# Patient Record
Sex: Male | Born: 1935 | Race: White | Hispanic: No | Marital: Single | Smoking: Never smoker
Health system: Southern US, Community
[De-identification: ages and names within clinical notes are randomized; demographics above are authoritative.]

## PROBLEM LIST (undated history)

## (undated) DIAGNOSIS — J189 Pneumonia, unspecified organism: Secondary | ICD-10-CM

## (undated) DIAGNOSIS — G8250 Quadriplegia, unspecified: Secondary | ICD-10-CM

## (undated) DIAGNOSIS — K229 Disease of esophagus, unspecified: Secondary | ICD-10-CM

## (undated) DIAGNOSIS — N390 Urinary tract infection, site not specified: Secondary | ICD-10-CM

## (undated) DIAGNOSIS — G809 Cerebral palsy, unspecified: Secondary | ICD-10-CM

## (undated) DIAGNOSIS — I10 Essential (primary) hypertension: Secondary | ICD-10-CM

## (undated) DIAGNOSIS — K219 Gastro-esophageal reflux disease without esophagitis: Secondary | ICD-10-CM

## (undated) HISTORY — PX: HAND SURGERY: SHX662

## (undated) HISTORY — PX: HERNIA REPAIR: SHX51

## (undated) HISTORY — PX: OTHER SURGICAL HISTORY: SHX169

---

## 1997-08-18 ENCOUNTER — Other Ambulatory Visit: Admission: RE | Admit: 1997-08-18 | Discharge: 1997-08-18 | Payer: Self-pay | Admitting: Internal Medicine

## 1997-08-25 ENCOUNTER — Other Ambulatory Visit: Admission: RE | Admit: 1997-08-25 | Discharge: 1997-08-25 | Payer: Self-pay | Admitting: Internal Medicine

## 1999-01-11 ENCOUNTER — Encounter: Payer: Self-pay | Admitting: Urology

## 1999-01-11 ENCOUNTER — Inpatient Hospital Stay (HOSPITAL_COMMUNITY): Admission: EM | Admit: 1999-01-11 | Discharge: 1999-01-15 | Payer: Self-pay | Admitting: Emergency Medicine

## 1999-01-12 ENCOUNTER — Encounter: Payer: Self-pay | Admitting: Urology

## 2001-12-03 ENCOUNTER — Encounter: Payer: Self-pay | Admitting: Internal Medicine

## 2001-12-03 ENCOUNTER — Ambulatory Visit (HOSPITAL_COMMUNITY): Admission: RE | Admit: 2001-12-03 | Discharge: 2001-12-03 | Payer: Self-pay | Admitting: Internal Medicine

## 2001-12-10 ENCOUNTER — Ambulatory Visit (HOSPITAL_COMMUNITY): Admission: RE | Admit: 2001-12-10 | Discharge: 2001-12-10 | Payer: Self-pay | Admitting: Internal Medicine

## 2001-12-10 ENCOUNTER — Encounter: Payer: Self-pay | Admitting: Internal Medicine

## 2001-12-11 ENCOUNTER — Emergency Department (HOSPITAL_COMMUNITY): Admission: EM | Admit: 2001-12-11 | Discharge: 2001-12-12 | Payer: Self-pay | Admitting: Emergency Medicine

## 2001-12-11 ENCOUNTER — Encounter: Payer: Self-pay | Admitting: Emergency Medicine

## 2001-12-31 ENCOUNTER — Encounter: Payer: Self-pay | Admitting: Internal Medicine

## 2001-12-31 ENCOUNTER — Ambulatory Visit (HOSPITAL_COMMUNITY): Admission: RE | Admit: 2001-12-31 | Discharge: 2001-12-31 | Payer: Self-pay | Admitting: Gastroenterology

## 2001-12-31 ENCOUNTER — Encounter (INDEPENDENT_AMBULATORY_CARE_PROVIDER_SITE_OTHER): Payer: Self-pay | Admitting: Specialist

## 2002-08-03 ENCOUNTER — Inpatient Hospital Stay (HOSPITAL_COMMUNITY): Admission: RE | Admit: 2002-08-03 | Discharge: 2002-08-04 | Payer: Self-pay | Admitting: General Surgery

## 2002-08-03 ENCOUNTER — Encounter: Payer: Self-pay | Admitting: General Surgery

## 2002-11-17 ENCOUNTER — Emergency Department (HOSPITAL_COMMUNITY): Admission: EM | Admit: 2002-11-17 | Discharge: 2002-11-18 | Payer: Self-pay | Admitting: Emergency Medicine

## 2002-11-18 ENCOUNTER — Encounter: Payer: Self-pay | Admitting: Emergency Medicine

## 2003-07-01 ENCOUNTER — Emergency Department (HOSPITAL_COMMUNITY): Admission: EM | Admit: 2003-07-01 | Discharge: 2003-07-01 | Payer: Self-pay | Admitting: *Deleted

## 2003-12-14 ENCOUNTER — Ambulatory Visit (HOSPITAL_COMMUNITY): Admission: RE | Admit: 2003-12-14 | Discharge: 2003-12-14 | Payer: Self-pay | Admitting: Internal Medicine

## 2004-06-16 ENCOUNTER — Emergency Department (HOSPITAL_COMMUNITY): Admission: EM | Admit: 2004-06-16 | Discharge: 2004-06-16 | Payer: Self-pay | Admitting: Emergency Medicine

## 2005-11-24 ENCOUNTER — Emergency Department (HOSPITAL_COMMUNITY): Admission: EM | Admit: 2005-11-24 | Discharge: 2005-11-24 | Payer: Self-pay | Admitting: Family Medicine

## 2006-01-20 ENCOUNTER — Ambulatory Visit (HOSPITAL_COMMUNITY): Admission: RE | Admit: 2006-01-20 | Discharge: 2006-01-20 | Payer: Self-pay | Admitting: Internal Medicine

## 2006-04-25 ENCOUNTER — Emergency Department (HOSPITAL_COMMUNITY): Admission: EM | Admit: 2006-04-25 | Discharge: 2006-04-25 | Payer: Self-pay | Admitting: Emergency Medicine

## 2006-05-07 ENCOUNTER — Inpatient Hospital Stay (HOSPITAL_COMMUNITY): Admission: EM | Admit: 2006-05-07 | Discharge: 2006-05-12 | Payer: Self-pay | Admitting: Emergency Medicine

## 2006-08-27 ENCOUNTER — Ambulatory Visit (HOSPITAL_COMMUNITY): Admission: RE | Admit: 2006-08-27 | Discharge: 2006-08-27 | Payer: Self-pay | Admitting: Internal Medicine

## 2007-04-08 ENCOUNTER — Ambulatory Visit: Payer: Self-pay | Admitting: Internal Medicine

## 2007-04-24 ENCOUNTER — Ambulatory Visit (HOSPITAL_COMMUNITY): Admission: RE | Admit: 2007-04-24 | Discharge: 2007-04-24 | Payer: Self-pay | Admitting: Internal Medicine

## 2007-08-12 ENCOUNTER — Emergency Department (HOSPITAL_COMMUNITY): Admission: EM | Admit: 2007-08-12 | Discharge: 2007-08-12 | Payer: Self-pay | Admitting: Emergency Medicine

## 2007-10-08 ENCOUNTER — Inpatient Hospital Stay (HOSPITAL_COMMUNITY): Admission: EM | Admit: 2007-10-08 | Discharge: 2007-10-19 | Payer: Self-pay | Admitting: Emergency Medicine

## 2007-10-09 ENCOUNTER — Encounter (INDEPENDENT_AMBULATORY_CARE_PROVIDER_SITE_OTHER): Payer: Self-pay | Admitting: *Deleted

## 2007-10-09 ENCOUNTER — Ambulatory Visit: Payer: Self-pay | Admitting: Vascular Surgery

## 2007-10-14 ENCOUNTER — Ambulatory Visit: Payer: Self-pay | Admitting: Infectious Disease

## 2007-10-26 ENCOUNTER — Encounter: Payer: Self-pay | Admitting: Infectious Disease

## 2007-11-02 ENCOUNTER — Telehealth: Payer: Self-pay | Admitting: Infectious Disease

## 2007-11-02 ENCOUNTER — Ambulatory Visit: Payer: Self-pay | Admitting: Infectious Disease

## 2007-11-02 DIAGNOSIS — IMO0002 Reserved for concepts with insufficient information to code with codable children: Secondary | ICD-10-CM | POA: Insufficient documentation

## 2007-11-02 DIAGNOSIS — IMO0001 Reserved for inherently not codable concepts without codable children: Secondary | ICD-10-CM

## 2007-11-03 ENCOUNTER — Encounter: Payer: Self-pay | Admitting: Infectious Disease

## 2007-11-09 ENCOUNTER — Ambulatory Visit: Payer: Self-pay | Admitting: Infectious Disease

## 2007-11-09 DIAGNOSIS — G809 Cerebral palsy, unspecified: Secondary | ICD-10-CM | POA: Insufficient documentation

## 2007-11-09 DIAGNOSIS — N390 Urinary tract infection, site not specified: Secondary | ICD-10-CM | POA: Insufficient documentation

## 2007-11-09 LAB — CONVERTED CEMR LAB
BUN: 25 mg/dL — ABNORMAL HIGH (ref 6–23)
Basophils Relative: 0 % (ref 0–1)
CO2: 23 meq/L (ref 19–32)
Chloride: 100 meq/L (ref 96–112)
Creatinine, Ser: 0.87 mg/dL (ref 0.40–1.50)
Hemoglobin: 12.9 g/dL — ABNORMAL LOW (ref 13.0–17.0)
Lymphocytes Relative: 20 % (ref 12–46)
MCHC: 33.2 g/dL (ref 30.0–36.0)
Monocytes Absolute: 0.7 10*3/uL (ref 0.1–1.0)
Monocytes Relative: 9 % (ref 3–12)
Neutro Abs: 5.1 10*3/uL (ref 1.7–7.7)
Neutrophils Relative %: 67 % (ref 43–77)
Potassium: 4.3 meq/L (ref 3.5–5.3)
RBC: 4.27 M/uL (ref 4.22–5.81)
WBC: 7.6 10*3/uL (ref 4.0–10.5)

## 2007-11-16 ENCOUNTER — Ambulatory Visit: Payer: Self-pay | Admitting: Infectious Disease

## 2009-03-11 ENCOUNTER — Emergency Department (HOSPITAL_COMMUNITY): Admission: EM | Admit: 2009-03-11 | Discharge: 2009-03-11 | Payer: Self-pay | Admitting: Emergency Medicine

## 2009-07-03 ENCOUNTER — Ambulatory Visit: Payer: Self-pay | Admitting: Internal Medicine

## 2009-07-03 DIAGNOSIS — Z860100 Personal history of colon polyps, unspecified: Secondary | ICD-10-CM | POA: Insufficient documentation

## 2009-07-03 DIAGNOSIS — K2270 Barrett's esophagus without dysplasia: Secondary | ICD-10-CM

## 2009-08-14 ENCOUNTER — Ambulatory Visit: Payer: Self-pay | Admitting: Internal Medicine

## 2009-08-23 ENCOUNTER — Telehealth: Payer: Self-pay | Admitting: Internal Medicine

## 2009-10-11 ENCOUNTER — Ambulatory Visit (HOSPITAL_COMMUNITY): Admission: RE | Admit: 2009-10-11 | Discharge: 2009-10-11 | Payer: Self-pay | Admitting: Internal Medicine

## 2009-12-09 IMAGING — RF DG NASO G TUBE PLC W/FL W/RAD
4 series · 4 of 4 positions shown · non-contrast
Comparison: None

CLINICAL DATA: Requires feeding tube

NG/OG TUBE BY VASWANI

[Series 1: run · 1 of 1 slices shown (1 of 4)]
[im 1/1]
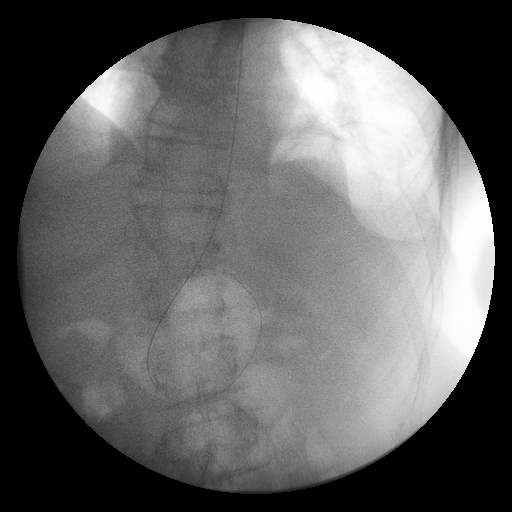

[Series 2: run · 1 of 1 slices shown (2 of 4)]
[im 1/1]
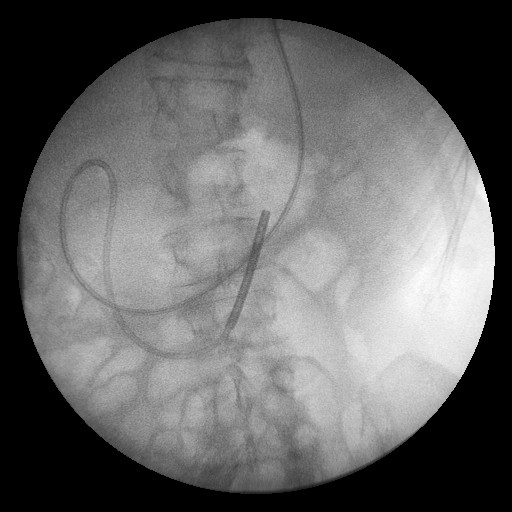

[Series 3: run · 1 of 1 slices shown (3 of 4)]
[im 1/1]
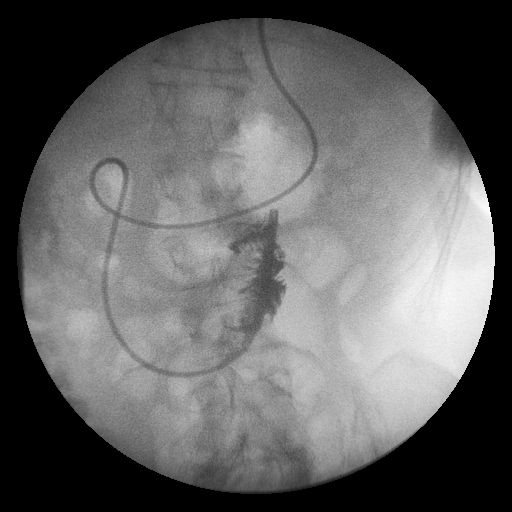

[Series 4: run · 1 of 1 slices shown (4 of 4)]
[im 1/1]
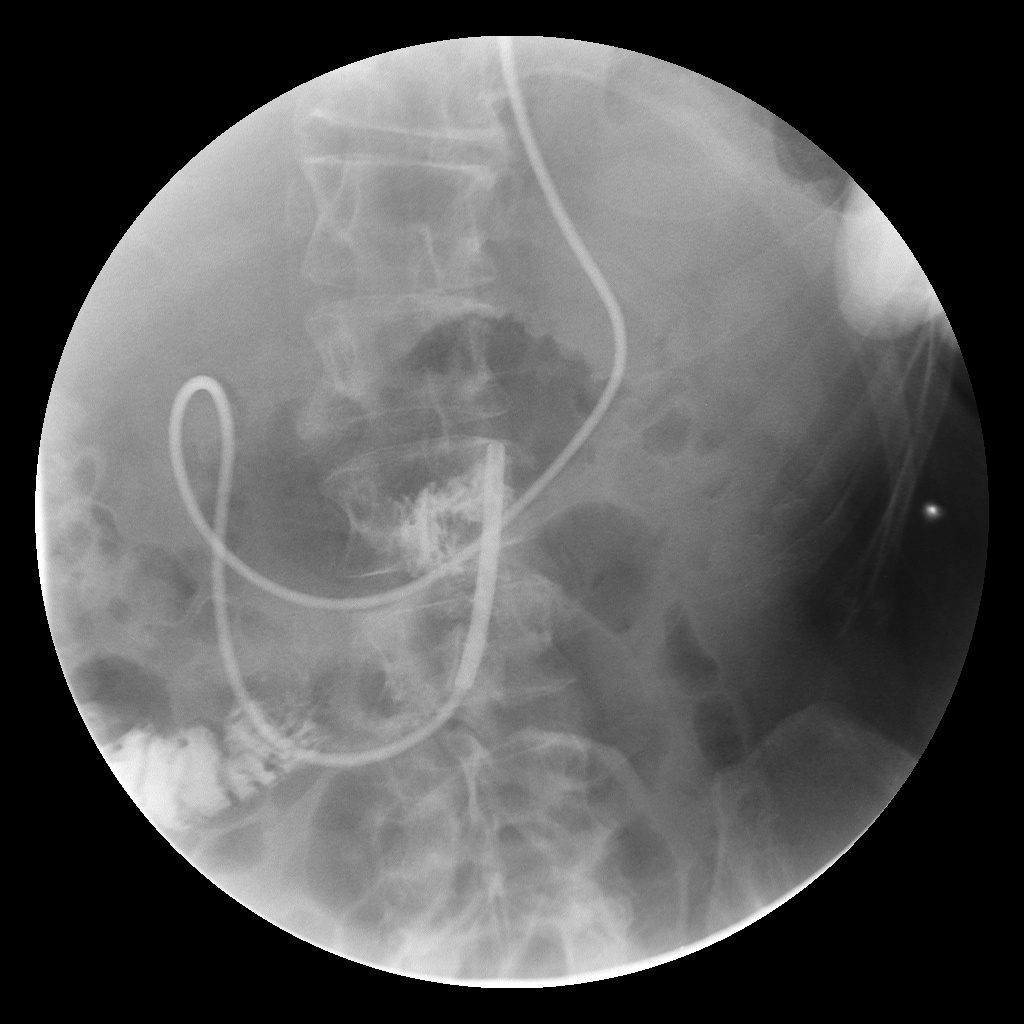

[4 of 4 positions shown; findings below may reference images not displayed]

FINDINGS: Under fluoroscopic guidance a feeding tube was placed
into the duodenum at the level of the ligament of Treitz.  Water-
soluble contrast material was injected through the feeding tube
confirming placement.  No complicating features identified.

5.5 minutes of fluoroscopy time was used.
IMPRESSION: Successful placement of feeding tube under fluoroscopic guidance to
the level of the ligament of Treitz.

## 2009-12-11 ENCOUNTER — Ambulatory Visit (HOSPITAL_COMMUNITY): Admission: RE | Admit: 2009-12-11 | Discharge: 2009-12-11 | Payer: Self-pay | Admitting: Urology

## 2009-12-14 IMAGING — CR DG CHEST 1V PORT
1 series · 1 of 1 positions shown · non-contrast
Comparison: 10/14/2096

CLINICAL DATA: Right upper extremity cellulitis.  Repositioning of
PICC placement.

PORTABLE CHEST - 1 VIEW

[AP]
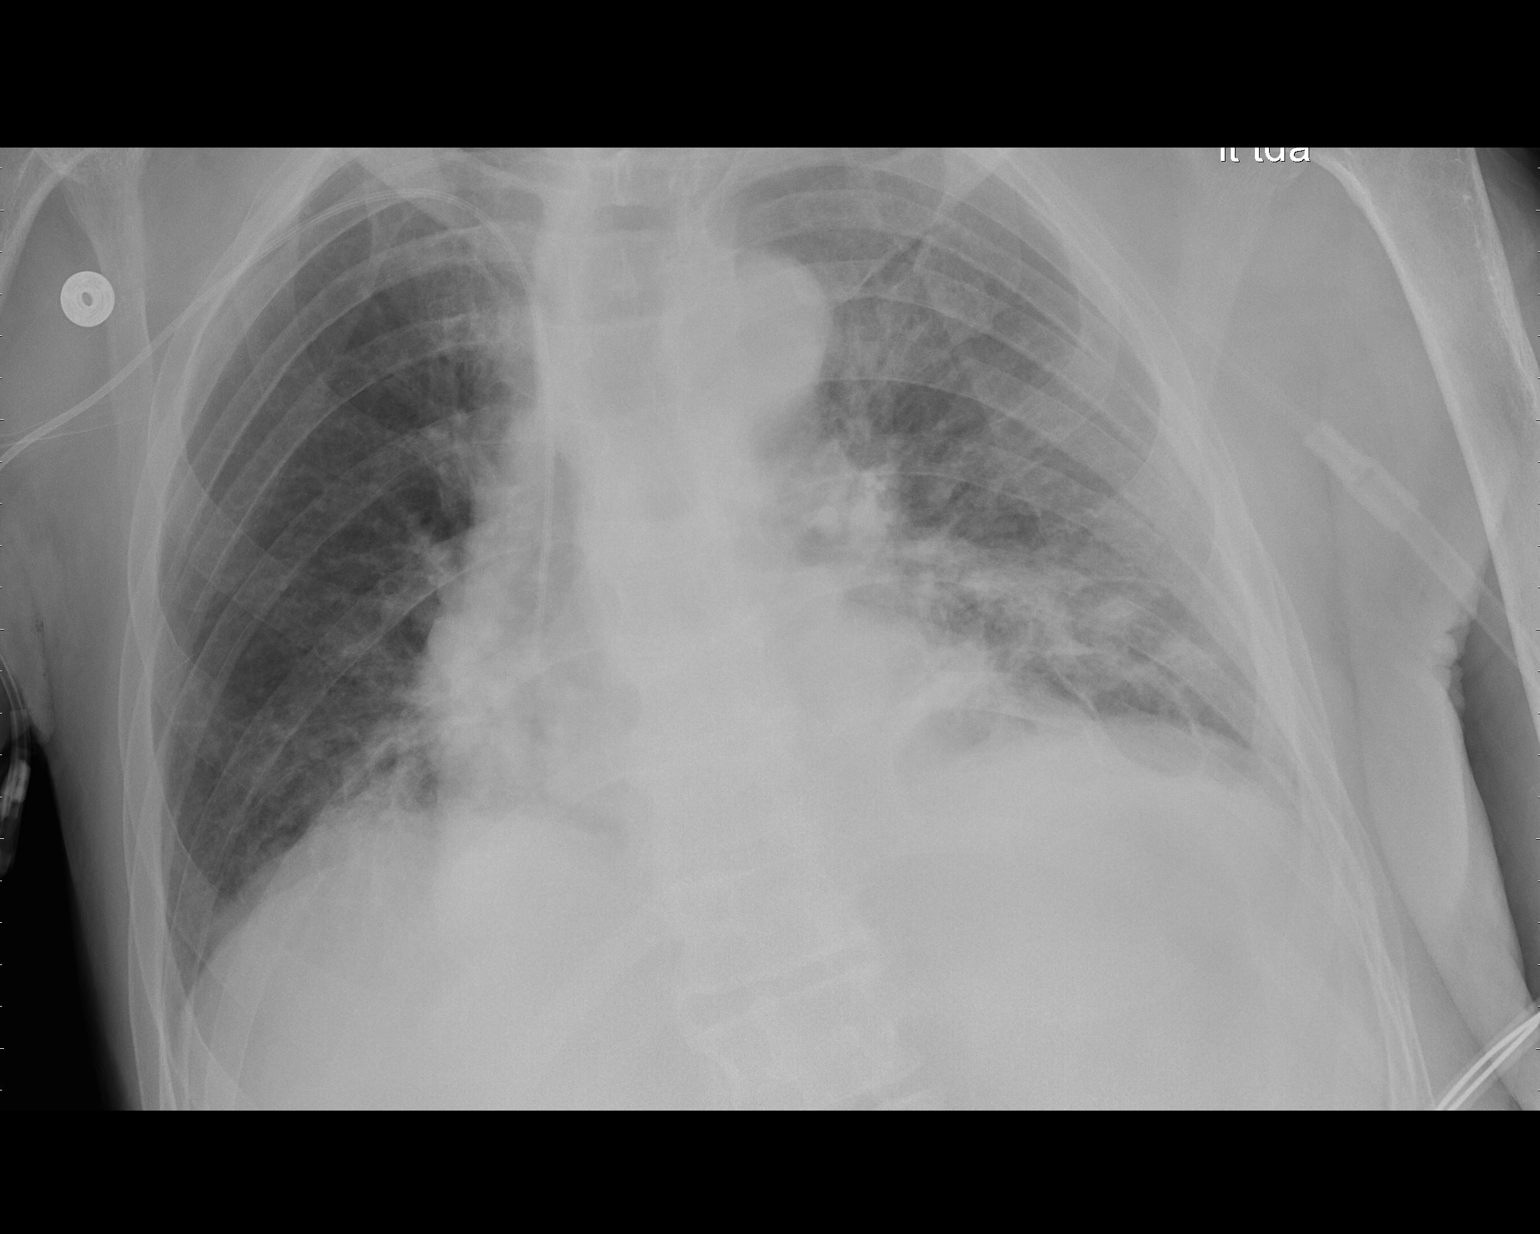

[1 of 1 positions shown; findings below may reference images not displayed]

FINDINGS: The PICC line now follows a normal course into the IVC.
The tip is at or potentially just below the cavoatrial junction.
Bibasilar airspace disease is redemonstrated.  Lung volumes are
low.
IMPRESSION: 1.  Repositioning of PICC line with the tip at or just below the
cavoatrial junction.
2.  Redemonstration of bibasilar airspace disease.

## 2010-04-15 ENCOUNTER — Encounter: Payer: Self-pay | Admitting: Internal Medicine

## 2010-04-24 NOTE — Assessment & Plan Note (Signed)
Summary: discuss EGD/sheri   History of Present Illness Visit Type: Follow-up Visit Primary GI MD: [name] MD [facility] Primary Provider: [name], MD Requesting Provider: [name], MD Chief Complaint: Discuss Barrett's and colon polyp surveillance History of Present Illness:   77 yo wm with cerebral pasy, GERD, Barrett's esophagus and prior colonic adenoma. Last endoscopic procedures 2003. He is in a wheelchair and lives at [facility]. He had been here last month and it was clarified with Dr. [name] that he was sent for possible surveillance/screening for Barrett's and colon cancer/polyps.   GI Review of Systems      Denies abdominal pain, acid reflux, belching, bloating, chest pain, dysphagia with liquids, dysphagia with solids, heartburn, loss of appetite, nausea, vomiting, vomiting blood, weight loss, and  weight gain.        Denies anal fissure, black tarry stools, change in bowel habit, constipation, diarrhea, diverticulosis, fecal incontinence, heme positive stool, hemorrhoids, irritable bowel syndrome, jaundice, light color stool, liver problems, rectal bleeding, and  rectal pain.    Current Medications (verified): 1)  Enalapril Maleate 20 Mg Tabs (Enalapril Maleate) .... One Take Tablet By Mouth Once Daily 2)  Metoclopramide Hcl 5 Mg  Tabs (Metoclopramide Hcl) .Marland Kitchen.. 1 Tab By Mouth Tid 3)  Ibuprofen 800 Mg  Tabs (Ibuprofen) .Marland Kitchen.. 1 Tablet By Mouth Three Times A Day As Needed For Pain 4)  Fortical 200 Unit/act Nasal Soln (Calcitonin ([place]mon)) .Marland Kitchen.. 1 Spray in  1 Nostril Daily 5)  Omeprazole 20 Mg  Cpdr (Omeprazole) .Marland Kitchen.. 1 Capsule By Mouth Qd 6)  Oxybutynin Chloride 10 Mg  Xr24h-Tab (Oxybutynin Chloride) .Marland Kitchen.. 1 Tab By Mouth Qd 7)  Tamsulosin Hcl 0.4 Mg Caps (Tamsulosin Hcl) .... One Tablet By Mouth Once Daily 8)  Docusate Calcium 240 Mg Caps (Docusate Calcium) .... One Tablet By Mouth Once Daily 9)  Milk of Magnesia 400 Mg/57ml Susp (Magnesium Hydroxide) .... 30ml Once  Daily 10)  Phenazopyridine Hcl 100 Mg Tabs (Phenazopyridine Hcl) .... One Tablet By Mouth Once Daily 11)  Miralax  Powd (Polyethylene Glycol 3350) .... As Needed 12)  Vitamin D3 1000iu Softgel .... Once Daily 13)  Thick It Original Instant Powder .... Use 1 Tablespoonful With Meal With Liquid 14)  Fleet Pediatric 3.5-9.5 Gm/80ml Enem (Sodium Phosphates) .... As Needed For Constipation  Allergies (verified): No Known Drug Allergies  Past History:  Past Medical History: Reviewed history from 07/03/2009 and no changes required. The patient's past medical history is significant for:   1. Cerebral palsy with quadriparesis.   2. Gastroesophageal reflux disease.   3. History of urinary tract infection with Citrobacter.   4. Dysphagia.   5. Quadriplegia.   6. Chronic recurrent bladder infections and bladder dysfunction       related to cerebral palsy.   7. Benign prostatic hypertrophy.   8. Barrett's esophagus.   9 Celllulitis and myositis as described above HTN Iron defiicency anemia  Past Surgical History: Hernia Surgery  Family History: No FH of Colon Cancer: Aneurysm brain: Mother Stroke: Father  Vital Signs:  Patient profile:   77 year old male Pulse rate:   64 / minute Pulse rhythm:   regular BP sitting:   100 / 68  (left arm) Cuff size:   regular  Vitals Entered By: June McMurray CMA Duncan Dull) (Aug 14, 2009 11:05 AM)  Physical Exam  General:  wheelchair body contorted   Impression & Recommendations:  Problem # 1:  BARRETT'S ESOPHAGUS (ICD-530.85) Assessment Unchanged We discussed this condition  and its cancer risk. He would not tolerate complications well and we have decided not to pursue screening/surveillance as risks do not seem to justify potential benefit. Will stop metacloprmide given known possible side effects. I think he could have some jerking movements of head related to this but unclear if that is cause given CP  If withdrawal not tolerated  restart Increase omeprazole to 40 mg daily.  no routine surveillance given comorbidities  Problem # 2:  PERSONAL HX COLONIC POLYPS (ICD-V12.72) Assessment: Unchanged 8 mm adenoma 2003 We discussed this condition and its cancer risk. He would not tolerate complications well and we have decided not to pursue screening/surveillance as risks do not seem to justify potential benefit.   Patient Instructions: 1)  Please schedule a follow-up appointment as needed for dysphagia. or other gastrointestinal problems 2)  Increase Omeprazole to 40mg  daily. 3)  Stop taking the metoclopramide.  4)  Copy sent to : [name], MD, [facility] 5)  The medication list was reviewed and reconciled.  All changed / newly prescribed medications were explained.  A complete medication list was provided to the patient / caregiver. Prescriptions: OMEPRAZOLE 40 MG CPDR (OMEPRAZOLE) 1 by mouth each AM 30-60 minutes before breakfast  #30 x 11   Entered and Authorized by:   Iva Boop MD, [facility]   Signed by:   Iva Boop MD, [facility] on 08/14/2009   Method used:   Print then Give to Patient   RxID:   [id number]

## 2010-04-24 NOTE — Assessment & Plan Note (Signed)
Summary: HX OF BARRETTS & POLYPS/YF   History of Present Illness Visit Type: consult  Primary GI MD: Victor Head MD [facility] Primary Provider: [name], MD Requesting Provider: [name], MD Chief Complaint: Barrett's esophagus History of Present Illness:   77 yo white man with prior diagnosis of Barrett's esophagus and adenoma of colon (2003). Procedures performed by [name]. He has cerebral palsy and a speech imediment but I a am able to understand him.No dysphagia, he says. He is not not really interested in endoscopy " unless I have to ".   GI Review of Systems      Denies abdominal pain, acid reflux, belching, bloating, chest pain, dysphagia with liquids, dysphagia with solids, heartburn, loss of appetite, nausea, vomiting, vomiting blood, weight loss, and  weight gain.        Denies anal fissure, black tarry stools, change in bowel habit, constipation, diarrhea, diverticulosis, fecal incontinence, heme positive stool, hemorrhoids, irritable bowel syndrome, jaundice, light color stool, liver problems, rectal bleeding, and  rectal pain.    Colonoscopy  Procedure date:  12/31/2001  Findings:      8 mm adenoma diverticulosis  EGD  Procedure date:  12/31/2001  Findings:      Gastritis Barrett's esophagus - long-segment   Current Medications (verified): 1)  Enalapril Maleate 20 Mg Tabs (Enalapril Maleate) .... One Take Tablet By Mouth Once Daily 2)  Metoclopramide Hcl 5 Mg  Tabs (Metoclopramide Hcl) .Marland Kitchen.. 1 Tab By Mouth Tid 3)  Ibuprofen 800 Mg  Tabs (Ibuprofen) .Marland Kitchen.. 1 Tablet By Mouth Three Times A Day As Needed For Pain 4)  Fortical 200 Unit/act Nasal Soln (Calcitonin ([place])) .Marland Kitchen.. 1 Spray in  1 Nostril Daily 5)  Omeprazole 20 Mg  Cpdr (Omeprazole) .Marland Kitchen.. 1 Capsule By Mouth Qd 6)  Oxybutynin Chloride 10 Mg  Xr24h-Tab (Oxybutynin Chloride) .Marland Kitchen.. 1 Tab By Mouth Qd 7)  Tamsulosin Hcl 0.4 Mg Caps (Tamsulosin Hcl) .... One Tablet By Mouth Once Daily 8)  Docusate  Calcium 240 Mg Caps (Docusate Calcium) .... One Tablet By Mouth Once Daily 9)  Milk of Magnesia 400 Mg/13ml Susp (Magnesium Hydroxide) .... 30ml Once Daily 10)  Phenazopyridine Hcl 100 Mg Tabs (Phenazopyridine Hcl) .... One Tablet By Mouth Once Daily 11)  Miralax  Powd (Polyethylene Glycol 3350) .... As Needed  Allergies (verified): No Known Drug Allergies  Past History:  Past Medical History: The patient's past medical history is significant for:   1. Cerebral palsy with quadriparesis.   2. Gastroesophageal reflux disease.   3. History of urinary tract infection with Citrobacter.   4. Dysphagia.   5. Quadriplegia.   6. Chronic recurrent bladder infections and bladder dysfunction       related to cerebral palsy.   7. Benign prostatic hypertrophy.   8. Barrett's esophagus.   9 Celllulitis and myositis as described above HTN Iron defiicency anemia  Family History: Reviewed history and no changes required.  Social History: Reviewed history from 11/09/2007 and no changes required. The patient resides at in [facility].      Review of Systems       in a wheelchair due to CP All other ROS negative except as per HPI.   Vital Signs:  Patient profile:   77 year old male Pulse rate:   60 / minute Pulse rhythm:   regular BP sitting:   98 / 58  (right arm) Cuff size:   regular  Vitals Entered By: [name] (July 03, 2009 11:41 AM)  Physical Exam  General:  wheelchair body contorted Eyes:  anicteric Mouth:  clear Neck:  supple Chest Wall:  kyphosisscoliosis.   Lungs:  Clear throughout to auscultation. Heart:  Regular rate and rhythm; no murmurs, rubs,  or bruits. Abdomen:  soft and nontender Neurologic:  Alert and  oriented x3 in a wheelchair unable to bear weight Psych:  Alert and cooperative. Normal mood and affect.   Impression & Recommendations:  Problem # 1:  BARRETT'S ESOPHAGUS (ICD-530.85) Assessment Unchanged discuss with [name] pt not  necessarily interested ? if risk benefit ratio worth it  Problem # 2:  PERSONAL HX COLONIC POLYPS (ICD-V12.72) Assessment: Unchanged  Patient Instructions: 1)  Dr. Leone Barrett will discuss your problems of Barrett's esophagus and prior colon polyps with Dr. Oneta Barrett and let you know our recommendations. 2)  Please continue current medications.  3)  Copy sent to : Victor Cowboy, MD 4)  The medication list was reviewed and reconciled.  All changed / newly prescribed medications were explained.  A complete medication list was provided to the patient / caregiver.  Appended Document: HX OF BARRETTS & POLYPS/YF I spoke to Dr. Oneta Barrett and the indication is polyp and Barrett's follow-up is there any way I can speak to [name] on the phone at [facility]? if not let's have him return for a visit to discuss his issues in May  Appended Document: HX OF BARRETTS & POLYPS/YF If you call the home number that is [facility] and they will get him on the phone for you let me know if you need me to do anything further  Appended Document: HX OF BARRETTS & POLYPS/YF lets have him coe back for an rev sometime in May i think an EGD is reasonable and would like to discuss if he is willing to return  Appended Document: HX OF BARRETTS & POLYPS/YF I have scheduled REV with [facility] for 5/23

## 2010-04-24 NOTE — Procedures (Signed)
Summary: Colonoscopy: Dr. Doreatha Martin: Adenoma   Colonoscopy  Procedure date:  12/31/2001  Findings:      Results: Diverticulosis.       Pathology:  Adenomatous polyp.        Location:  [facility].   Patient Name: [name], [name]. MRN: [id number] Procedure Procedures: Colonoscopy CPT: 5755247644.  Personnel: Endoscopist: Ulyess Mort, MD.  Referred By: Marinus Maw, MD.  Exam Location: Exam performed in Endoscopy Suite.  Patient Consent: Procedure, Alternatives, Risks and Benefits discussed, consent obtained,  Indications  Evaluation of: Positive fecal occult blood test  Average Risk Screening Routine.  History  Pre-Exam Physical: Performed Dec 31, 2001. Abdominal exam, Extremity exam, Mental status exam WNL.  Exam Exam: Extent of exam reached: Cecum, extent intended: Cecum.  The cecum was identified by appendiceal orifice and IC valve. Colon retroflexion performed. Images were not taken. ASA Classification: II.  Monitoring: Pulse and BP monitoring, Oximetry used. Supplemental O2 given.  Fluoroscopy: Fluoroscopy was not used.  Sedation Meds: Fentanyl 50 mcg. given IV. Versed 4 mg. given IV.  Findings - DIVERTICULOSIS: Ascending Colon to Sigmoid Colon. ICD9: Diverticulosis: 562.10. Comments: mild divert.  POLYP: Sigmoid Colon, Maximum size: 8 mm. sessile polyp. Procedure:  snare with cautery, removed, retrieved, Polyp sent to pathology. ICD9: Colon Polyps: 211.3.   Assessment Abnormal examination, see findings above.  Diagnoses: 211.3: Colon Polyps.  562.10: Diverticulosis.   Events  Unplanned Interventions: No intervention was required.  Unplanned Events: There were no complications. Plans Medication Plan: Await pathology. Continue current medications.  Patient Education: Patient given standard instructions for: Polyps. Diverticulosis. Yearly hemoccult testing recommended. Patient instructed to get routine colonoscopy every 4 years.    Disposition: After procedure patient sent to recovery.

## 2010-04-24 NOTE — Progress Notes (Signed)
Summary: Meds causing burning  Phone Note Call from Patient Call back at Home Phone [phone]   Caller: [facility] Call For: Dr Leone Payor Summary of Call: Omeprazole is causing burning.  Initial call taken by: Leanor Kail [facility],  August 23, 2009 3:19 PM  Follow-up for Phone Call        I spoke with Claris Che patient is c/o urinary burning and burning in his penis.  I have advised her that omeprazole will not cause urinary or penis burning.  Claris Che is advised to have him see his primary care for eval of this. Follow-up by: Darcey Nora RN, CGRN,  August 23, 2009 3:44 PM

## 2010-04-24 NOTE — Procedures (Signed)
Summary: EGD: Dr. Doreatha Martin: Barrett's   EGD  Procedure date:  12/31/2001  Findings:      Findings: Gastritis, Barrett's Location: [facility]   Patient Name: Aleph, Nickson. MRN: [id number] Procedure Procedures: Panendoscopy (EGD) CPT: 43235.  Personnel: Endoscopist: Ulyess Mort, MD.  Referred By: Marinus Maw, MD.  Exam Location: Exam performed in Endoscopy Suite.  Patient Consent: Procedure, Alternatives, Risks and Benefits discussed, consent obtained,  Indications  Evaluation of: Positive fecal occult blood test per home screening.  Symptoms: Reflux symptoms  Screening for: Personal history of  .  History  Pre-Exam Physical: Performed Dec 31, 2001  Abdominal exam, Extremity exam, Mental status exam WNL.  Exam Exam Info: Maximum depth of insertion Duodenum, intended Duodenum. Vocal cords visualized. Gastric retroflexion performed. Images were not taken. ASA Classification: II.  Sedation Meds: Fentanyl 25 mcg. given IV. Versed 3 mg. given IV.  Monitoring: BP and pulse monitoring done. Oximetry used. Supplemental O2 given  Fluoroscopy: Fluoroscopy was not used.  Findings - BARRETT'S ESOPHAGUS: LES 0 cm. Length of Barrett's 14 cm. No inflammation present. A retroflex image was taken. Biopsy/Barrett's taken. ICD9: Barrett's: 530.2.  - ULCER: in Mid Esophagus Maximum size: 10 mm. Not bleeding, clear ulcer base. An image was taken. Biopsy/Ulcer taken. Comment: 6cm. h. h.  - MUCOSAL ABNORMALITY: Duodenal Bulb to Jejunum. Granular mucosa.  - MUCOSAL ABNORMALITY: Body to Antrum. Granular mucosa. RUT done, results pending. ICD9: Gastritis, Chronic: 535.10. Comment: mild gastric polyps.   Assessment Abnormal examination, see findings above.  Diagnoses: 530.2: Barrett's.  535.10: Gastritis, Chronic.   Events  Unplanned Intervention: No unplanned interventions were required.  Unplanned Events: There were no  complications. Plans Medication(s): Continue current medications. PPI: Omeprazole/Prilosec  Ulcer Meds: Carafate 1gm. QID, starting Dec 31, 2001   Patient Education: Patient given standard instructions for: Hiatal Hernia. Reflux.  Disposition: After procedure patient sent to recovery.   CC:   [name], MD  This report was created from the original endoscopy report, which was reviewed and signed by the above listed endoscopist.

## 2010-06-07 LAB — CBC
HCT: 42.6 % (ref 39.0–52.0)
MCH: 30.7 pg (ref 26.0–34.0)
MCV: 91.1 fL (ref 78.0–100.0)
RBC: 4.68 MIL/uL (ref 4.22–5.81)
WBC: 8.3 10*3/uL (ref 4.0–10.5)

## 2010-06-07 LAB — SURGICAL PCR SCREEN: MRSA, PCR: NEGATIVE

## 2010-06-07 LAB — BASIC METABOLIC PANEL
BUN: 10 mg/dL (ref 6–23)
CO2: 26 mEq/L (ref 19–32)
Chloride: 99 mEq/L (ref 96–112)
GFR calc Af Amer: >60 mL/min (ref >60)
Potassium: 4.4 mEq/L (ref 3.5–5.1)

## 2010-08-07 NOTE — Discharge Summary (Signed)
[name], Barrett            ACCOUNT NO.:  [id number]   MEDICAL RECORD NO.:  [id number]          PATIENT TYPE:  INP   LOCATION:  3002                         FACILITY:  [facility]   PHYSICIAN:  Altha Harm, MDDATE OF BIRTH:  01-16-1928   DATE OF ADMISSION:  10/08/2007  DATE OF DISCHARGE:                               DISCHARGE SUMMARY   ANTICIPATED DATE OF DISCHARGE:  October 19, 2007.   DISCHARGE DISPOSITION:  Back to his group home.   DISCHARGE DIAGNOSES:  1. Cellulitis of the left forearm and elbow.  2. Questionable septic joint, awaiting synovial cultures.  3. Newly diagnosed hypertension.  4. History of cerebral palsy.  5. Dysphagia.  6. History of hyponatremia.   DISCHARGE MEDICATIONS:  Include the following:  1. Prilosec 20 mg p.o. daily.  2. Colace 100 mg p.o. daily.  3. Fortical 200 units 1 spray inhaled daily.  4. Oxybutynin ER 10 mg p.o. daily.  5. Uroxatral 10 mg p.o. daily.  6. Ferrous sulfate 160 mg p.o. b.i.d.  7. Reglan 5 mg p.o. t.i.d.  8. Vitamin D 4000 international units p.o. b.i.d.  9. Ibuprofen 800 mg p.o. t.i.d. p.r.n.  10.Metoprolol 25 mg p.o. b.i.d.  11.Vancomycin initially 1750 mg IV q.24h. (vancomycin needs to be      titrated to keep the trough level greater than 15).  12.Hydrochlorothiazide 12.5 mg p.o. daily.   CONSULTANTS:  1. Dr. Ninetta Lights and Dr. Daiva Eves, Infectious Disease.  2. Dr. Magnus Ivan, Orthopedic Surgery.   PROCEDURES:  1. Aspiration of the left elbow done on October 15, 2007.  2. PICC line placed on October 15, 2007.   DIAGNOSTIC STUDIES:  1. Portable chest x-ray done on admission which shows:      a.     Mild cardiomegaly.      b.     Pulmonary vascular congestion.      c.     Bibasilar air space disease and probable left pleural       effusion.  2. CT of the upper extremity with contrast which shows findings      compatible with cellulitis and myofasciitis to the left forearm and      distal upper forearm.  No soft tissue  abscess or signs of      osteomyelitis demonstrated by CT.  3. CT head without contrast done on the 18th shows atrophy and chronic      small vessel disease.  No chronic or reversible process, no      apparent change when compared to previous examination.  4. MRI of the left upper extremity which shows:      a.     Progressive changes of cellulitis and myofasciitis       throughout the left upper arm and forearm.  Moderate sized elbow       joint effusion nonspecific, joint infection not infectious.      b.     No focal soft tissue absence or evidence of osteomyelitis on       noncontrast imaging.  5. Portable chest x-ray done on the 23rd which shows right PICC line  following a normal course into the IVC.  The tip is at, or      potentially just below, the cavoatrial junction.  Bibasilar air      space disease, demonstrating lung volumes are low.   CODE STATUS:  Full code.   ALLERGIES:  No known drug allergies.   CHIEF COMPLAINT:  Persistent left upper extremity swelling and redness.   HISTORY OF PRESENT ILLNESS:  Please see the H&P dictated by Dr. Flonnie Overman  for details of the HPI.   HOSPITAL COURSE:  1. Cellulitis of the left upper extremity.  The patient was admitted      and started on IV antibiotics including vancomycin and Zosyn.      Initially, there was very poor progress of the cellulitis.  It      actually appeared to get worse; however, with ongoing antibiotics      the patient had an improvement.  I invited Infectious Diseases to      offer an opinion on the duration of the therapy and the choice of      antibiotics.  Dr. Ninetta Lights saw the patient and narrowed the spectrum      of antibiotics to just vancomycin.  However, the patient continued      to have increasing fluid collection in the left upper extremity.      An MR was done which showed myofasciitis and cellulitis and some      joint effusion.  The orthopedic surgeons were invited to assist      with joint  aspiration.  This was done and the fluid has been sent      off for evaluation.  Thus far, the fluid has not healed any      pathogens and the Gram stain profile is that of a noninfectious      fluid.  The patient has had ongoing treatment with vancomycin and      the recommendation from the infectious disease service is that the      patient should have vancomycin through October 28, 2007.  Thereafter,      the patient should be placed on doxycycline 100 mg p.o. b.i.d.      until the redness resolves.  The patient should make an appointment      to be seen by Dr. Daiva Eves, in the office of the Infectious      Diseases, at the end of the course of the vancomycin or sooner if      the redness continues to get worse or swelling continues to get      worse.  Currently, the patient is on 750 mg of vancomycin q.24h.      However, the vancomycin trough levels will need to be monitored to      keep the trough greater than 15.  2. Dysphagia.  The patient was initially assessed to have dysphagia      and was seen by Speech Pathology.  A Panda tube was placed while      the patient was n.p.o. waiting a repeat swallow study.  The      patient, however, verbalized quite strongly that he did not want to      undergo further swallow study and that he was not willing to forego      oral intake for a feeding tube.  He stated emphatically that he did      not want a feeding tube and advocated for himself.  Thus, the  second swallow study was abandoned, the Panda tube was removed and      the patient was allowed to eat, as this, according to him, is his      favorite thing to do.  3. Hypertension.  The patient had escalations of his blood pressure      while hospitalized which would be a new diagnosis of hypertension      for the patient.  He was initially started on metoprolol in      addition to Norvasc and hydrochlorothiazide.  The patient has had      improvement of his blood pressures and the Norvasc  has been      discontinued.  The patient will be discharged home with metoprolol      25 mg p.o. b.i.d. and hydrochlorothiazide 12.5 mg p.o. daily.   All other medical problems for this patient remained stable and the plan  is for the patient to return home to his group home with home care  therapy providing IV antibiotics..  The patient resides at [facility].  Physical Therapy has made recommendations for a standard  walker while the patient's left upper extremity is healing and  Occupational Therapy should see the patient in home care, as well as  Physical Therapy, to assess the home environment and the appropriate  adaptive equipment needed for this patient in the home environment.   DIETARY RESTRICTIONS:  None.   PHYSICAL RESTRICTIONS:  The patient is wheelchair-bound and should  remain that way until the left upper extremity is healed without any  attempts for ambulation unless directed by physical therapy and  occupational therapy.      Altha Harm, MD  Electronically Signed     MAM/MEDQ  D:  10/18/2007  T:  10/18/2007  Job:  [id number]

## 2010-08-07 NOTE — H&P (Signed)
NAMEMERCED, HANNERS NO.:  [id number]   MEDICAL RECORD NO.:  [id number]         PATIENT TYPE:  CINP   LOCATION:                               FACILITY:  [facility]   PHYSICIAN:  Lucita Ferrara, MD         DATE OF BIRTH:  [birth year]   DATE OF ADMISSION:  10/08/2007  DATE OF DISCHARGE:                              HISTORY & PHYSICAL   The patient 77 year old with cerebral palsy presents with persistent  left upper extremity swelling redness which was thought to be a bug bite  on Tuesday.  The patient thereafter presented to his primary care  physician who was placed on antibiotics with no resolution of his  symptoms and progression of his cellulitis.  The patient denies any  fevers, chest pain, shortness of breath, pruritus or intolerance to his  antibiotics.  There is no active drainage.  The patient was treated with  doxycycline.   The patient's past medical history is significant for:  1. Cerebral palsy with quadriparesis.  2. Gastroesophageal reflux disease.  3. History of urinary tract infection with Citrobacter.  4. Dysphagia.  5. Quadriplegia.  6. Chronic recurrent bladder infections and bladder dysfunction      related to cerebral palsy.  7. Benign prostatic hypertrophy.  8. Barrett's esophagus.   SOCIAL HISTORY:  The patient resides at home.  Denies drugs alcohol,  tobacco.  The patient resides in [facility].   ALLERGIES:  No known drug allergies.   MEDICATIONS:  At home include:  1. Colace.  2. Ferrous sulfate.  3. Fortical nasal.  4. Metoclopramide, Reglan.  5. Nitrofurantoin.  6. Omeprazole.  7. Oxybutynin.  8. Uroxatrol.   REVIEW OF SYSTEMS:  Otherwise negative per HPI.   PHYSICAL EXAMINATION:  Generally speaking, the patient is in no acute  distress.  He is actually able to communicate with me about his  concerns.  HEENT:  Normocephalic, atraumatic.  PERLA.  External muscles intact.  CARDIOVASCULAR:  S1/S2.  Regular rate and rhythm.  No  murmurs, rubs,  clicks.  ABDOMEN:  Is soft, nontender, nondistended.  Positive bowel.  EXTREMITIES:  There is a small abrasion of the right hip approximately  1.5 cm per 1.  Left elbow persistently swollen.  There is no active areas of drainage  process but warm to touch.   EMERGENCY DEPARTMENT COURSE:  The patient was started on IV antibiotics,  IV hydration.   LABORATORY DATA:  Sodium 128, hemoglobin 12.9, hematocrit 38, white  count 14.9, neutrophils 91.  BUN 21, creatinine 0.8.   ASSESSMENT/PLAN:  A 77 year old with:  1. Left lower extremity cellulitis, swelling, pain, most likely      cellulitis.  This is not amendable to outpatient treatment with      doxycycline.  2. Cerebral palsy with quadriplegia, urinary tract infection and      dysphagia.  3. Leukocytosis likely secondary to #1.  Rule out underlying pneumonia      or urinary tract infection.  4. History of gastroesophageal reflux disease.  5. Hyponatremia.  6. Dysphagia.   PLAN:  We will go ahead  and admit the patient to the medical telemetry  unit.  Will get left upper extremity cultures, empiric antibiotics with  vancomycin and Zosyn.  Left lower extremity ultrasound Doppler to rule  out underlying deep vein thrombosis.  Monitor electrolytes and sodium.  IV fluids with normal saline.  Continue diet at nursing home facility.  Speech therapy consultation.  Blood cultures, UA, urine culture.  DVT  and GI prophylaxis.  The rest of the plans are dependent on his  progress, and perhaps the patient would require incision and drainage if  his symptoms are persistent and not better.  Surgical consultation.      Lucita Ferrara, MD  Electronically Signed     RR/MEDQ  D:  10/08/2007  T:  10/08/2007  Job:  [id number]

## 2010-08-07 NOTE — Assessment & Plan Note (Signed)
[facility]                         GASTROENTEROLOGY OFFICE NOTE   NAME:[name], [name]                   MRN:          [id number]  DATE:04/08/2007                            DOB:          1928-03-17    CHIEF COMPLAINT:  Dysphagia.   A 77 year old white man with cerebral palsy and known oropharyngeal  dysphagia and aspiration.  He has been coughing and choking when he  drinks and eats sometimes.  I do not think there is any esophageal  dysphagia.  He had previous hospitalization and aspiration pneumonia  suspected, February 2008, at which speech pathology showed he had  chronic aspiration.  He did not want a gastrostomy tube at that time.  He is on a diet with Thick-It.  It sounds like he has been having some  more problems noticed at the nursing home, so he was sent for  evaluation.   RECOMMENDATIONS AND PLAN:  He will have a repeat modified barium swallow  to reassess.  He will continue his metoclopramide and omeprazole.  Further plans pending that evaluation.  He indicates he is still not  interested in a gastrostomy tube if that was a suggestion, and it is not  at this time as he seems to be tolerating things reasonably well from  what I can tell.   He does have a history of Barrett's esophagus, there was no dysplasia on  the biopsies in 2003.  He did have some inflammation at the time.  We  will consider a surveillance endoscopy in this man, but given his  overall health status, I am not sure it makes sense.  We will see what  the modified barium swallow shows.   HISTORY OF PRESENT ILLNESS:  Please see my medical information sheet for  full details.   MEDICATIONS:  1. Omeprazole 20 mg daily.  2. DC-240 daily 40 mg daily.  3. Fortical 200 units.  4. Normal saline each nostril daily.  5. Oxybutynin 10 mg daily.  6. Uroxatral 10 mg every day.  7. Metoclopramide 5 mg t.i.d.  8. Thick-It powder with meals.  9. Vitamin D 2 to 3 times a  day with meals.  10.Amoxicillin 250 mg t.i.d. for bladder infections suppression.  11.Ibuprofen.  12.Robaxin p.r.n.   DRUG ALLERGIES:  None known.   PAST MEDICAL HISTORY:  1. Inguinal hernia repair.  2. Cerebral palsy with quadriparesis.  3. Gastroesophageal reflux disease.  4. Family history of stomach cancer, EGD December 31, 2001, showing      Barrett's esophagus, chronic gastritis.  Colonoscopy October 2003      showing diverticulosis and an 8-mm tubular adenoma of the colon.  5. Chronic recurrent bladder infection and bladder dysfunction related      to cerebral palsy.  6. Vitamin D deficiency.  7. Benign prostatic hypertrophy.  8. Venous insufficiency with dependent edema.  9. Hypertension.  10.History of iron deficiency anemia, hemoglobin 11.1, March 05, 2007, with normal MCV.     Iva Boop, MD,FACG  Electronically Signed    CEG/MedQ  DD: 04/11/2007  DT: 04/11/2007  Job #: [id number]  cc:   [name], M.D.

## 2010-08-10 NOTE — Discharge Summary (Signed)
NAMEKENTON, [name]            ACCOUNT NO.:  [id number]   MEDICAL RECORD NO.:  [id number]          PATIENT TYPE:  INP   LOCATION:  5740                         FACILITY:  [facility]   PHYSICIAN:  Madaline Savage, MD        DATE OF BIRTH:  Dec 14, 1927   DATE OF ADMISSION:  05/06/2006  DATE OF DISCHARGE:  05/12/2006                               DISCHARGE SUMMARY   PRIMARY CARE PHYSICIAN:  Unassigned.   DISCHARGE DIAGNOSES:  1. Bilateral pneumonia.  2. Urinary tract infection with Citrobacter.  3. Dysphagia.  4. Quadriplegia.  5. Cerebral palsy.   DISCHARGE MEDICATIONS:  1. Omeprazole 20 mg once daily.  2. UroXatral 20 mg once daily.  3. Uracid three tab b.i.d.  4. Ibuprofen 800 mg t.i.d. p.r.n.  5. Docusate 240 mg once daily.  6. Mucinex 600 mg b.i.d. for one week.  7. Sweet oil instilled in both ears at bedtime twice weekly.   HISTORY:  For a full history and physical, see the history and physical  dictated by Dr. Fatima Sanger B. Bakare on May 06, 2006.  In short, Mr.  Maltos is a 77 year old unfortunate gentleman with cerebral palsy and  quadriparesis, who lives in a group home.  He was transferred to the  hospital with complaints of fever, nausea and vomiting.  His initial  chest x-ray done in the emergency room showed bilateral pneumonia and so  he was admitted for further evaluation and management.   PROCEDURES DONE IN HOSPITAL:  He had an x-ray of the chest done on  May 06, 2006, which showed bilateral air space disease, worrisome  for pneumonia.   HOSPITAL COURSE:  #1 - BILATERAL PNEUMONIA:  His x-ray of the chest  showed bilateral pneumonia and he was started on Rocephin and Zithromax.  He clinically did not have a pneumonia.  He was seen to be at his  baseline.  His oxygen saturations were within normal limits.  We  continued him on the antibiotics when he was in the hospital.   #2 - URINARY TRACT INFECTION:  He had a urinary tract infection on  admission and  his urine culture grew Citrobacter, which was sensitive to  Rocephin.  He was treated with Rocephin for five days.  He will not need  any more antibiotics at the nursing home.   #3 - DYSPHAGIA:  He has dysphagia which is chronic for him, with his  quadriparesis and cerebral palsy.  We did a speech study here in the  hospital and went ahead and performed a modified barium swallow.  The  modified barium swallow showed that he chronically aspirates and he has  been chronically aspirating.  After the speech therapist's  recommendation, I discussed with the patient.  I told him that he could  have a feeding tube if he needed, but even if he had a feeding tube, he  can still aspirate his saliva contents. The patient said that he would  want to continue eating his current diet, so he will continue on his  current diet at the nursing home.  He understands that he does  have a  risk of aspiration.   #4 - QUADRIPARESIS AND CEREBRAL PALSY:  We continued his medications as  before.   DISPOSITION:  He will now be discharged back to a nursing home in a  stable condition.  He will have a dysphagia-3 diet with nectar-thick  liquids.  He can have chopped meats and his medicines will be crushed in  the nectar.  He will follow up with the doctor at the skilled nursing  facility.      Madaline Savage, MD  Electronically Signed     PKN/MEDQ  D:  05/11/2006  T:  05/11/2006  Job:  [id number]

## 2010-08-10 NOTE — Op Note (Signed)
NAME:  [name], [name] NO.:  [id number]   MEDICAL RECORD NO.:  [id number]                   PATIENT TYPE:  INP   LOCATION:  2870                                 FACILITY:  [facility]   PHYSICIAN:  Gita Kudo, M.D.              DATE OF BIRTH:  Oct 04, 1927   DATE OF PROCEDURE:  08/03/2002  DATE OF DISCHARGE:                                 OPERATIVE REPORT   PREOPERATIVE DIAGNOSIS:  Left inguinal hernia.   POSTOPERATIVE DIAGNOSIS:  Left inguinal hernia, combined large sliding  indirect and small direct.   OPERATION PERFORMED:  Left inguinal hernia repair with preperitoneal and  onlay Prolene mesh.   SURGEON:  Gita Kudo, M.D.   ANESTHESIA:  General.   INDICATIONS FOR PROCEDURE:  The patient is a 77 year old now with severe  cerebral palsy, developed a painful bulge in his left groin.   OPERATIVE FINDINGS:  The patient had a large sliding inguinal hernia that  had some sigmoid colon in it.  He had a smaller direct hernia.   DESCRIPTION OF PROCEDURE:  Under satisfactory general endotracheal  anesthesia, having received 1.0g Ancef preop, the patient's abdomen and  genitalia were prepped and draped in the standard fashion.  A total of 30mL  of 0.5% Marcaine with epinephrine was infiltrated throughout the procedure  for good postoperative analgesia. A transverse incision was made after  proper prepping and draping and positioning.  This was carried down to and  through the external ring and external oblique.  Bleeders were coagulated or  tied with 3-0 Vicryl.  Self-retaining retractors were used and the cord and  its content mobilized.  The indirect sac identified, dissected high and  opened. The sigmoid colon component was carefully dissected away from the  sac itself and reduced into the abdomen.  Then the large sac was controlled  with a running 0 Prolene locked suture.  Following this, the sac was  returned through the internal ring.  The  floor of the canal opened from the  pubis through the internal ring and finger dissection used to develop the  preperitoneal space.  This was dissected from the pubis out to the femoral  vessels and it was beneath the inferior epigastric vessels.  Then the  contents were held away with a moistened gauze while an oval of  approximately 2 x 3 inches was placed into the preperitoneal space, anchored  to Cooper's ligament with a 0  Prolene suture and unfolded inferiorly and  laterally.  Then the gauze was removed and the mesh unfolded superiorly and  medially.  A single 0 Prolene suture was used to approximate the mesh to the  undersurface of the abdominal wall. Then the floor was closed over the mesh  taking intermittent bites of the mesh with a running 0 Prolene suture and  tied at the internal ring.  The ring was snug and the ends of the suture  left long.  The remainder of the mesh was tailored into an oval to fit in  the floor.  Anchored at the internal ring with the previous suture and then  with interrupted 0 Prolene to the soft tissue near the pubis, inguinal  ligament, internal oblique.  The tails were then brought around the cord and  sutured to each other above and lateral to the cord.  When completed, the  mesh lay in good apposition to the floor of the canal and then closure begun  with running 2-0 Vicryl for external oblique,  interrupted 2-0 Vicryl for deep fascia, 3-0 Vicryl for subcu and Steri-  Strips for skin.  Sterile absorbent dressing was then applied and the  patient went to the recovery room from the operating room in good condition  without complication.                                                Gita Kudo, M.D.    MRL/MEDQ  D:  08/03/2002  T:  08/04/2002  Job:  [id number]   cc:   [name], M.D.  [address], [address]  [place], [place] [address]  Fax: [phone]

## 2010-08-10 NOTE — H&P (Signed)
NAME:  [name], [name]            ACCOUNT NO.:  [id number]   MEDICAL RECORD NO.:  [id number]          PATIENT TYPE:  EMS   LOCATION:  MAJO                         FACILITY:  [facility]   PHYSICIAN:  Mobolaji B. Bakare, M.D.DATE OF BIRTH:  [birth year]   DATE OF ADMISSION:  05/06/2006  DATE OF DISCHARGE:                              HISTORY & PHYSICAL   PRIMARY CARE PHYSICIAN:  Unassigned.   CHIEF COMPLAINT:  Fever.   HISTORY OF PRESENTING COMPLAINT:  [name] is a 77 year old  Caucasian male with cerebral palsy and quadriparesis.  He resides in a  group home.  He was transferred to the hospital because of fever.  This  fever was noted yesterday.  The patient had nausea and vomiting.  Temperature was 103.  The patient is able to give history.  He stated he  has been having cough but no chest pain and some shortness of breath, no  wheezing.  He had a chest x-ray done in the emergency room which showed  bibasilar airspace disease compatible with pneumonia.  His urinalysis  was also strongly positive for a urinary tract infection.  He admitted  he has been having some dysuria.  The patient does not have an  indwelling Foley catheter.   REVIEW OF SYSTEMS:  No diarrhea or abdominal pain.  No headaches, chest  pain.   PAST MEDICAL HISTORY:  1. Cerebral palsy.  2. Quadriplegia.   MEDICATIONS:  1. Omeprazole 20 mg daily.  2. UroXatral 20 mg daily.  3. Urocit 3 tablets twice a day.  4. Ibuprofen 800 mg t.i.d. p.r.n.  5. Docusate 240 mg once a day.  6. Robitussin DM 4 times a day.  7. __________ HC 10 mL every 6 hours.  8. Sweet oil instill to both ears at bedtime 2 times weekly.   ALLERGIES:  No known drug allergies.   FAMILY HISTORY:  Noncontributory.   SOCIAL HISTORY:  The patient resides in [facility].  He does not drink  alcohol or smoke cigarettes.  He is quadriplegic.   PHYSICAL EXAMINATION:  INITIAL VITALS:  Temperature 101.9, blood  pressure 120/85, pulse of 116,  respiratory rate of 24, O2 saturations of  89% on room air.  GENERAL:  The patient is awake, alert, not in obvious respiratory  distress.  Slightly tachypneic.  Prominent neck veins.  Spastic  paralysis.  NECK:  No carotid bruit.  LUNGS:  Bibasilar crackles, more on the right than on the left.  CVS:  S1 and S2 regular.  ABDOMEN:  Not distended, soft, nontender.  Bowel sounds present.  EXTREMITIES:  No pedal edema or calf tenderness.  CNS:  The patient is quadriparetic with spasticity of both upper and  lower extremities.   INITIAL LABORATORY DATA:  White cells of 23.8, hemoglobin 14.1,  hematocrit 41.4, platelets 223, lymphocytes 91%.  Sodium 125, potassium  3.3, creatinine 0.7, BUN 11, glucose 110, chloride 103, bicarbonate 25.  BMET showed creatinine of 1.44 and BUN of 11.  Calcium 8.8.  Urinalysis  cloudy in appearance with positive nitrites and large leukocytes.  Microscopic showed many bacteria, 21-50 white blood  cells.   ASSESSMENT AND PLAN:  [name] is a 77 year old resident of [facility] presenting with fever, leukocytosis, cough.  Chest x-ray  compatible with bibasilar pneumonia.  He has pyuria on urinalysis.   ADMISSION DIAGNOSES:  1. Bilateral pneumonia.  2. Pyelonephritis.  3. Hypokalemia.  4. Cerebral palsy.  5. Quadriparesis.   PLAN:  Admit to regular floor.  IV fluid, normal saline at 125 mL per  hour.  Potassium supplements.  Blood culture and urine culture have been  sent.  Will send sputum culture if possible.  IV Rocephin 1 gm q.24h.  Zithromax 500 mg IV q.24h.  We will resume other oral medications.      Mobolaji B. Corky Downs, M.D.  Electronically Signed     MBB/MEDQ  D:  05/07/2006  T:  05/07/2006  Job:  [id number]

## 2010-12-21 LAB — CBC
HCT: 36.1 — ABNORMAL LOW
HCT: 38 — ABNORMAL LOW
HCT: 38.4 — ABNORMAL LOW
HCT: 39
Hemoglobin: 12.4 — ABNORMAL LOW
Hemoglobin: 12.6 — ABNORMAL LOW
Hemoglobin: 12.9 — ABNORMAL LOW
Hemoglobin: 12.9 — ABNORMAL LOW
MCHC: 32.7
MCHC: 33
MCHC: 33.2
MCHC: 33.4
MCHC: 33.5
MCHC: 33.9
MCV: 91.6
MCV: 91.6
MCV: 91.9
MCV: 92.3
MCV: 92.8
Platelets: 142 — ABNORMAL LOW
Platelets: 214
Platelets: 229
RBC: 4.1 — ABNORMAL LOW
RBC: 4.1 — ABNORMAL LOW
RBC: 4.13 — ABNORMAL LOW
RBC: 4.17 — ABNORMAL LOW
RDW: 15
RDW: 15.4
RDW: 15.4
RDW: 15.9 — ABNORMAL HIGH
WBC: 10.6 — ABNORMAL HIGH
WBC: 11.4 — ABNORMAL HIGH
WBC: 11.5 — ABNORMAL HIGH
WBC: 8.8
WBC: 9.5

## 2010-12-21 LAB — URINALYSIS, MICROSCOPIC ONLY
Bilirubin Urine: NEGATIVE
Hgb urine dipstick: NEGATIVE
Specific Gravity, Urine: 1.013
Urobilinogen, UA: 0.2

## 2010-12-21 LAB — BASIC METABOLIC PANEL
CO2: 29
CO2: 31
Calcium: 8.5
Calcium: 8.5
Chloride: 104
Chloride: 105
Creatinine, Ser: 0.57
GFR calc Af Amer: >60
GFR calc Af Amer: >60
GFR calc Af Amer: >60
GFR calc non Af Amer: >60
Glucose, Bld: 100 — ABNORMAL HIGH
Sodium: 138
Sodium: 142

## 2010-12-21 LAB — DIFFERENTIAL
Basophils Absolute: 0
Basophils Absolute: 0
Basophils Absolute: 0.1
Basophils Absolute: 0.2 — ABNORMAL HIGH
Basophils Relative: 0
Basophils Relative: 0
Basophils Relative: 1
Basophils Relative: 1
Basophils Relative: 1
Eosinophils Absolute: 0.1
Eosinophils Absolute: 0.3
Eosinophils Absolute: 0.4
Eosinophils Absolute: 0.4
Eosinophils Absolute: 0.4
Eosinophils Relative: 1
Eosinophils Relative: 3
Eosinophils Relative: 5
Lymphs Abs: 0.9
Monocytes Absolute: 0.4
Monocytes Absolute: 0.5
Monocytes Relative: 4
Monocytes Relative: 5
Neutro Abs: 8.3 — ABNORMAL HIGH
Neutrophils Relative %: 78 — ABNORMAL HIGH
Neutrophils Relative %: 80 — ABNORMAL HIGH
Neutrophils Relative %: 81 — ABNORMAL HIGH
Neutrophils Relative %: 84 — ABNORMAL HIGH

## 2010-12-21 LAB — POCT I-STAT, CHEM 8
Calcium, Ion: 0.92 — ABNORMAL LOW
HCT: 38 — ABNORMAL LOW
Hemoglobin: 12.9 — ABNORMAL LOW
TCO2: 21

## 2010-12-21 LAB — COMPREHENSIVE METABOLIC PANEL
Alkaline Phosphatase: 57
BUN: 11
Chloride: 105
GFR calc non Af Amer: >60
Glucose, Bld: 80
Potassium: 3.8
Total Bilirubin: 1.5 — ABNORMAL HIGH

## 2010-12-21 LAB — CULTURE, BLOOD (ROUTINE X 2)

## 2010-12-21 LAB — URINE CULTURE
Colony Count: NO GROWTH
Culture: NO GROWTH

## 2010-12-21 LAB — SYNOVIAL CELL COUNT + DIFF, W/ CRYSTALS
Crystals, Fluid: NONE SEEN
Lymphocytes-Synovial Fld: 13
Neutrophil, Synovial: 76 — ABNORMAL HIGH
WBC, Synovial: 100

## 2010-12-21 LAB — SEDIMENTATION RATE: Sed Rate: 19 — ABNORMAL HIGH

## 2010-12-21 LAB — BODY FLUID CULTURE

## 2010-12-21 LAB — VANCOMYCIN, TROUGH: Vancomycin Tr: 12.6

## 2010-12-21 LAB — C-REACTIVE PROTEIN: CRP: 5.4 — ABNORMAL HIGH (ref <0.6)

## 2013-01-25 ENCOUNTER — Encounter (HOSPITAL_COMMUNITY): Payer: Self-pay | Admitting: Emergency Medicine

## 2013-01-25 ENCOUNTER — Emergency Department (HOSPITAL_COMMUNITY)
Admission: EM | Admit: 2013-01-25 | Discharge: 2013-01-26 | Disposition: A | Discharge status: Skilled Nursing Facility | Payer: Medicare Other | Attending: Emergency Medicine | Admitting: Emergency Medicine

## 2013-01-25 DIAGNOSIS — I10 Essential (primary) hypertension: Secondary | ICD-10-CM | POA: Insufficient documentation

## 2013-01-25 DIAGNOSIS — Z79899 Other long term (current) drug therapy: Secondary | ICD-10-CM | POA: Insufficient documentation

## 2013-01-25 DIAGNOSIS — N390 Urinary tract infection, site not specified: Secondary | ICD-10-CM

## 2013-01-25 DIAGNOSIS — K219 Gastro-esophageal reflux disease without esophagitis: Secondary | ICD-10-CM | POA: Insufficient documentation

## 2013-01-25 DIAGNOSIS — Z8701 Personal history of pneumonia (recurrent): Secondary | ICD-10-CM | POA: Insufficient documentation

## 2013-01-25 DIAGNOSIS — R339 Retention of urine, unspecified: Secondary | ICD-10-CM | POA: Insufficient documentation

## 2013-01-25 DIAGNOSIS — G8250 Quadriplegia, unspecified: Secondary | ICD-10-CM | POA: Insufficient documentation

## 2013-01-25 DIAGNOSIS — Z792 Long term (current) use of antibiotics: Secondary | ICD-10-CM | POA: Insufficient documentation

## 2013-01-25 HISTORY — DX: Disease of esophagus, unspecified: K22.9

## 2013-01-25 HISTORY — DX: Gastro-esophageal reflux disease without esophagitis: K21.9

## 2013-01-25 HISTORY — DX: Quadriplegia, unspecified: G82.50

## 2013-01-25 HISTORY — DX: Urinary tract infection, site not specified: N39.0

## 2013-01-25 HISTORY — DX: Cerebral palsy, unspecified: G80.9

## 2013-01-25 HISTORY — DX: Pneumonia, unspecified organism: J18.9

## 2013-01-25 HISTORY — DX: Essential (primary) hypertension: I10

## 2013-01-25 NOTE — ED Notes (Signed)
Bed: ZO10 Expected date:  Expected time:  Means of arrival:  Comments: EMS/urinary retention

## 2013-01-25 NOTE — ED Notes (Signed)
PER EMS- pt picked up from [facility] with c/o urinary retention.  Pt reports he hasn't been able to urinate all day.  Pt has hx of UTIs and arrived to ED with foley catheter in place, with no drainage.  Pt denies pain.  Pt is quadriplegic and wheelchair bound.

## 2013-01-25 NOTE — ED Notes (Signed)
Pt has greater than 200 ml in bladder

## 2013-01-26 LAB — URINALYSIS, ROUTINE W REFLEX MICROSCOPIC
Ketones, ur: NEGATIVE mg/dL
Nitrite: POSITIVE — AB
Protein, ur: NEGATIVE mg/dL
Urobilinogen, UA: 0.2 mg/dL (ref 0.0–1.0)

## 2013-01-26 LAB — URINE MICROSCOPIC-ADD ON

## 2013-01-26 MED ORDER — LIDOCAINE HCL 2 % EX GEL
CUTANEOUS | Status: AC
  Administered 2013-01-26: 01:00:00
  Filled 2013-01-26: qty 10

## 2013-01-26 MED ORDER — CEPHALEXIN 500 MG PO CAPS: 500.0000 mg | ORAL_CAPSULE | Freq: Two times a day (BID) | ORAL | Status: DC

## 2013-01-26 MED ORDER — CEPHALEXIN 500 MG PO CAPS
500.0000 mg | ORAL_CAPSULE | Freq: Once | ORAL | Status: AC
  Administered 2013-01-26: 500 mg via ORAL
  Filled 2013-01-26: qty 1

## 2013-01-26 NOTE — ED Provider Notes (Signed)
**Note Victor-Identified via Obfuscation** CSN: [id number]     Arrival date & time 01/25/13  2201 History   First MD Initiated Contact with Patient 01/25/13 2253     Chief Complaint  Patient presents with  . Urinary Retention   (Consider location/radiation/quality/duration/timing/severity/associated sxs/prior Treatment) HPI  [name] is a 79 y.o. male with past medical history significant for quadriplegia, cerebral palsy with chronic indwelling Foley sent from [facility] with complaints of urinary retention. Patient has not been able to urinate all day, has a history of urinary tract infections. Patient reports slight discomfort in the lower abdomen. Denies fever, nausea or vomiting. Foley was last changed 3 weeks ago, patient states he hasn't changed approximately every 4 weeks.  Past Medical History  Diagnosis Date  . Hypertension   . Cerebral palsy   . UTI (lower urinary tract infection)   . Pneumonia   . Esophageal disorder   . GERD (gastroesophageal reflux disease)   . Quadriplegia    History reviewed. No pertinent past surgical history. History reviewed. No pertinent family history. History  Substance Use Topics  . Smoking status: Never Smoker   . Smokeless tobacco: Not on file  . Alcohol Use: No    Review of Systems  10 systems reviewed and found to be negative, except as noted in the HPI    Allergies  Review of patient's allergies indicates not on file.  Home Medications   Current Outpatient Rx  Name  Route  Sig  Dispense  Refill  . acetaminophen (TYLENOL) 325 MG tablet   Oral   Take 650 mg by mouth every 6 (six) hours as needed for pain.         . cloNIDine (CATAPRES - DOSED IN MG/24 HR) 0.1 mg/24hr patch   Transdermal   Place 1 patch onto the skin once a week.         . docusate calcium (SURFAK) 240 MG capsule   Oral   Take 240 mg by mouth daily.         . finasteride (PROSCAR) 5 MG tablet   Oral   Take 5 mg by mouth daily.         Marland Kitchen guaifenesin  (ROBITUSSIN) 100 MG/5ML syrup   Oral   Take 200 mg by mouth 2 (two) times daily. scheduled         . lidocaine (XYLOCAINE) 2 % solution   Oral   Take 15 mLs by mouth every 2 (two) hours as needed for pain. Apply to penis for discomfort         . lisinopril (PRINIVIL,ZESTRIL) 5 MG tablet   Oral   Take 5 mg by mouth daily.         . magnesium hydroxide (MILK OF MAGNESIA) 400 MG/5ML suspension   Oral   Take 30 mLs by mouth daily as needed for constipation.         . Melatonin 1 MG TABS   Oral   Take 1 mg by mouth at bedtime.         . Meth-Hyo-M Bl-Na Phos-Ph Sal (URIBEL PO)   Oral   Take 1 capsule by mouth 3 (three) times daily as needed. For uti         . omeprazole (PRILOSEC) 40 MG capsule   Oral   Take 40 mg by mouth daily.         . polyethylene glycol (MIRALAX / GLYCOLAX) packet   Oral   Take 17 g by mouth daily as  needed.         . traZODone (DESYREL) 50 MG tablet   Oral   Take 50 mg by mouth at bedtime as needed for sleep.         . cephALEXin (KEFLEX) 500 MG capsule   Oral   Take 1 capsule (500 mg total) by mouth 2 (two) times daily.   20 capsule   0    BP 106/57  Pulse 89  Temp(Src) 99.6 F (37.6 C) (Rectal)  Resp 16  SpO2 95% Physical Exam  Nursing note and vitals reviewed. Constitutional: He is oriented to person, place, and time. He appears well-developed and well-nourished. No distress.  Cachectic  HENT:  Head: Normocephalic and atraumatic.  Mouth/Throat: Oropharynx is clear and moist.  Eyes: Conjunctivae and EOM are normal. Pupils are equal, round, and reactive to light.  Neck: Normal range of motion.  Cardiovascular: Normal rate, regular rhythm and intact distal pulses.   Pulmonary/Chest: Effort normal and breath sounds normal. No stridor. No respiratory distress. He has no wheezes. He has no rales. He exhibits no tenderness.  Abdominal: Soft. Bowel sounds are normal. He exhibits no distension and no mass. There is no  tenderness. There is no rebound and no guarding.  Musculoskeletal: Normal range of motion.  Contracted  Neurological: He is alert and oriented to person, place, and time.  Psychiatric: He has a normal mood and affect.  Patient is verbal, oriented to person place and time.    ED Course  Procedures (including critical care time) Labs Review Labs Reviewed  URINALYSIS, ROUTINE W REFLEX MICROSCOPIC - Abnormal; Notable for the following:    APPearance CLOUDY (*)    Nitrite POSITIVE (*)    Leukocytes, UA LARGE (*)    All other components within normal limits  URINE MICROSCOPIC-ADD ON - Abnormal; Notable for the following:    Bacteria, UA MANY (*)    All other components within normal limits  URINE CULTURE  POCT I-STAT CREATININE   Imaging Review No results found.  EKG Interpretation   None       MDM   1. Urinary retention   2. UTI (lower urinary tract infection)      Filed Vitals:   01/25/13 2225 01/26/13 0051  BP: 109/83 106/57  Pulse: 89 89  Temp: 97.6 F (36.4 C) 99.6 F (37.6 C)  TempSrc: Oral Rectal  Resp: 16 16  SpO2: 95% 95%     [name] is a 79 y.o. male with urinary retention and chronic indwelling Foley, after leg bag was changed Foley flowed freely. Foley is discolored and it is reasonable to change it at this point. Over 500 cc of urine expressed after Foley was changed. Creatinine is normal, urinalysis is consistent with infection with positive nitrites and large leukocyte into numerous to count white blood cells with many bacteria. Chart review shows that no prior urine culture and sensitivities are on file. I will start him on  Note: Portions of this report may have been transcribed using voice recognition software. Every effort was made to ensure accuracy; however, inadvertent computerized transcription errors may be present      Wynetta Emery, PA-C 01/26/13 0111  Wynetta Emery, PA-C 01/26/13 0139

## 2013-01-26 NOTE — ED Notes (Signed)
Discharged back to SNF in stable condition-foley intact with cloudy yellow urine-[facility]AR has pt's discharge instructions and Rx-report to facility per [facility] RN-clothing in personal belongings bag/transported with pt-pt in gown-clothing soiled

## 2013-01-26 NOTE — ED Provider Notes (Signed)
Medical screening examination/treatment/procedure(s) were performed by non-physician practitioner and as supervising physician I was immediately available for consultation/collaboration.   Trudie Cervantes, MD 01/26/13 0514 

## 2013-01-27 LAB — URINE CULTURE

## 2013-02-15 ENCOUNTER — Encounter (HOSPITAL_COMMUNITY): Payer: Self-pay | Admitting: Emergency Medicine

## 2013-02-15 ENCOUNTER — Emergency Department (HOSPITAL_COMMUNITY)
Admission: EM | Admit: 2013-02-15 | Discharge: 2013-02-15 | Disposition: A | Discharge status: Home / Self Care | Payer: Medicare Other | Attending: Emergency Medicine | Admitting: Emergency Medicine

## 2013-02-15 DIAGNOSIS — K219 Gastro-esophageal reflux disease without esophagitis: Secondary | ICD-10-CM | POA: Insufficient documentation

## 2013-02-15 DIAGNOSIS — N390 Urinary tract infection, site not specified: Secondary | ICD-10-CM | POA: Insufficient documentation

## 2013-02-15 DIAGNOSIS — Z79899 Other long term (current) drug therapy: Secondary | ICD-10-CM | POA: Insufficient documentation

## 2013-02-15 DIAGNOSIS — I10 Essential (primary) hypertension: Secondary | ICD-10-CM | POA: Insufficient documentation

## 2013-02-15 DIAGNOSIS — Z8701 Personal history of pneumonia (recurrent): Secondary | ICD-10-CM | POA: Insufficient documentation

## 2013-02-15 DIAGNOSIS — G830 Diplegia of upper limbs: Secondary | ICD-10-CM | POA: Insufficient documentation

## 2013-02-15 LAB — CBC WITH DIFFERENTIAL/PLATELET
Basophils Absolute: 0 10*3/uL (ref 0.0–0.1)
Basophils Relative: 0 % (ref 0–1)
Eosinophils Absolute: 0 10*3/uL (ref 0.0–0.7)
Hemoglobin: 12.3 g/dL — ABNORMAL LOW (ref 13.0–17.0)
Lymphocytes Relative: 8 % — ABNORMAL LOW (ref 12–46)
MCH: 28.1 pg (ref 26.0–34.0)
MCHC: 33.5 g/dL (ref 30.0–36.0)
Monocytes Absolute: 0.7 10*3/uL (ref 0.1–1.0)
Monocytes Relative: 5 % (ref 3–12)
Neutro Abs: 11.6 10*3/uL — ABNORMAL HIGH (ref 1.7–7.7)
Platelets: 286 10*3/uL (ref 150–400)
RDW: 15.5 % (ref 11.5–15.5)
WBC: 13.5 10*3/uL — ABNORMAL HIGH (ref 4.0–10.5)

## 2013-02-15 LAB — BASIC METABOLIC PANEL
BUN: 18 mg/dL (ref 6–23)
CO2: 24 mEq/L (ref 19–32)
Calcium: 9.4 mg/dL (ref 8.4–10.5)
Chloride: 94 mEq/L — ABNORMAL LOW (ref 96–112)
Creatinine, Ser: 0.5 mg/dL (ref 0.50–1.35)
GFR calc Af Amer: >90 mL/min (ref >90)
Glucose, Bld: 100 mg/dL — ABNORMAL HIGH (ref 70–99)
Sodium: 130 mEq/L — ABNORMAL LOW (ref 135–145)

## 2013-02-15 LAB — URINALYSIS, ROUTINE W REFLEX MICROSCOPIC
Glucose, UA: NEGATIVE mg/dL
Ketones, ur: 15 mg/dL — AB
Nitrite: POSITIVE — AB
Protein, ur: >300 mg/dL — AB

## 2013-02-15 LAB — URINE MICROSCOPIC-ADD ON

## 2013-02-15 MED ORDER — LIDOCAINE HCL 2 % EX GEL
Freq: Once | CUTANEOUS | Status: AC
  Administered 2013-02-15: 10 via URETHRAL
  Filled 2013-02-15: qty 10

## 2013-02-15 MED ORDER — CEPHALEXIN 500 MG PO CAPS
500.0000 mg | ORAL_CAPSULE | Freq: Once | ORAL | Status: AC
  Administered 2013-02-15: 500 mg via ORAL
  Filled 2013-02-15: qty 1

## 2013-02-15 MED ORDER — HYDROCODONE-ACETAMINOPHEN 5-325 MG PO TABS
1.0000 | ORAL_TABLET | Freq: Once | ORAL | Status: AC
  Administered 2013-02-15: 1 via ORAL
  Filled 2013-02-15: qty 1

## 2013-02-15 MED ORDER — STARCH (THICKENING) PO POWD
ORAL | Status: DC | PRN
  Filled 2013-02-15: qty 227

## 2013-02-15 MED ORDER — CEPHALEXIN 500 MG PO CAPS: 500.0000 mg | ORAL_CAPSULE | Freq: Two times a day (BID) | ORAL | Status: DC

## 2013-02-15 MED ORDER — LIDOCAINE VISCOUS 2 % MT SOLN: 15.0000 mL | Freq: Once | OROMUCOSAL | Status: DC

## 2013-02-15 MED ORDER — HYDROCODONE-ACETAMINOPHEN 5-325 MG PO TABS: 1.0000 | ORAL_TABLET | Freq: Once | ORAL | Status: DC

## 2013-02-15 NOTE — ED Notes (Signed)
Pt arrived with his own pillows which needs to be returned with him.

## 2013-02-15 NOTE — ED Provider Notes (Signed)
CSN: [id number]     Arrival date & time 02/15/13  0240 History   First MD Initiated Contact with Patient 02/15/13 0256     Chief Complaint  Patient presents with  . Penis Pain    Patient is a 79 y.o. male presenting with penile pain.  Penis Pain   Patient presents to the emergency room from his nursing facility with complaints of penile pain. Has a history of cerebral palsy. He has an indwelling Foley catheter. He has a history of frequent urinary tract infections. Around 11 PM yesterday he started having pain in the penile area. Is also having a burning sensation. The symptoms are similar to prior episodes of urinary tract infections. Patient has not had any fevers. Denies any abdominal pain he denies any vomiting or diarrhea. The pain is constant and nothing seems to help. Past Medical History  Diagnosis Date  . Hypertension   . Cerebral palsy   . UTI (lower urinary tract infection)   . Pneumonia   . Esophageal disorder   . GERD (gastroesophageal reflux disease)   . Quadriplegia    History reviewed. No pertinent past surgical history. History reviewed. No pertinent family history. History  Substance Use Topics  . Smoking status: Never Smoker   . Smokeless tobacco: Not on file  . Alcohol Use: No    Review of Systems  Genitourinary: Positive for penile pain.  All other systems reviewed and are negative.    Allergies  Review of patient's allergies indicates no known allergies.  Home Medications   Current Outpatient Rx  Name  Route  Sig  Dispense  Refill  . acetaminophen (TYLENOL) 325 MG tablet   Oral   Take 650 mg by mouth every 6 (six) hours as needed for pain.         Marland Kitchen albuterol (PROVENTIL) (2.5 MG/3ML) 0.083% nebulizer solution   Nebulization   Take 2.5 mg by nebulization every 4 (four) hours. Add with ipratropium         . cloNIDine (CATAPRES - DOSED IN MG/24 HR) 0.1 mg/24hr patch   Transdermal   Place 1 patch onto the skin once a week.         .  docusate calcium (SURFAK) 240 MG capsule   Oral   Take 240 mg by mouth daily.         . finasteride (PROSCAR) 5 MG tablet   Oral   Take 5 mg by mouth daily.         . food thickener (THICK-IT #2) POWD   Oral   Take 36 Containers by mouth continuous. 3.5 teaspoons in 4 oz liquid  nectar thick all liquids  Per MAR         . guaifenesin (ROBITUSSIN) 100 MG/5ML syrup   Oral   Take 200 mg by mouth 2 (two) times daily. scheduled         . ipratropium (ATROVENT) 0.02 % nebulizer solution   Nebulization   Take 0.5 mg by nebulization 4 (four) times daily. Add with albuterol neb         . lidocaine (XYLOCAINE) 2 % solution   Oral   Take 15 mLs by mouth every 2 (two) hours as needed for pain. Apply to penis for discomfort         . lisinopril (PRINIVIL,ZESTRIL) 5 MG tablet   Oral   Take 5 mg by mouth daily.         . magnesium hydroxide (MILK OF MAGNESIA) 400  MG/5ML suspension   Oral   Take 30 mLs by mouth daily as needed for constipation.         . Melatonin 1 MG TABS   Oral   Take 1 mg by mouth at bedtime.         . Meth-Hyo-M Bl-Na Phos-Ph Sal (URIBEL PO)   Oral   Take 1 capsule by mouth 3 (three) times daily as needed. For uti         . omeprazole (PRILOSEC) 40 MG capsule   Oral   Take 40 mg by mouth daily.         . polyethylene glycol (MIRALAX / GLYCOLAX) packet   Oral   Take 17 g by mouth daily as needed.         . traZODone (DESYREL) 50 MG tablet   Oral   Take 50 mg by mouth at bedtime as needed for sleep.         . cephALEXin (KEFLEX) 500 MG capsule   Oral   Take 1 capsule (500 mg total) by mouth 2 (two) times daily.   20 capsule   0   . cephALEXin (KEFLEX) 500 MG capsule   Oral   Take 1 capsule (500 mg total) by mouth 2 (two) times daily.   20 capsule   0    BP 118/91  Pulse 112  Temp(Src) 98.2 F (36.8 C) (Oral)  Resp 20  SpO2 93% Physical Exam  Nursing note and vitals reviewed. Constitutional: No distress.  HENT:   Head: Normocephalic and atraumatic.  Right Ear: External ear normal.  Left Ear: External ear normal.  Eyes: Conjunctivae are normal. Right eye exhibits no discharge. Left eye exhibits no discharge. No scleral icterus.  Neck: Neck supple. No tracheal deviation present.  Cardiovascular: Normal rate, regular rhythm and intact distal pulses.   Pulmonary/Chest: Effort normal and breath sounds normal. No stridor. No respiratory distress. He has no wheezes. He has no rales.  Abdominal: Soft. Bowel sounds are normal. He exhibits no distension. There is no tenderness. There is no rebound and no guarding.  Genitourinary: No penile erythema. No discharge found.  Foley cather in place  Musculoskeletal: He exhibits no edema and no tenderness.  Neurological: He is alert. He has normal strength. He displays atrophy. No sensory deficit. Cranial nerve deficit:  no gross defecits noted. He exhibits abnormal muscle tone. He displays no seizure activity. Coordination abnormal.  Paraplegia, spasticity upper extremities and contractions  Skin: Skin is warm and dry. No rash noted. He is not diaphoretic.  Psychiatric: He has a normal mood and affect.    ED Course  Procedures (including critical care time) Labs Review Labs Reviewed  CBC WITH DIFFERENTIAL - Abnormal; Notable for the following:    WBC 13.5 (*)    Hemoglobin 12.3 (*)    HCT 36.7 (*)    Neutrophils Relative % 86 (*)    Neutro Abs 11.6 (*)    Lymphocytes Relative 8 (*)    All other components within normal limits  BASIC METABOLIC PANEL - Abnormal; Notable for the following:    Sodium 130 (*)    Chloride 94 (*)    Glucose, Bld 100 (*)    All other components within normal limits  URINALYSIS, ROUTINE W REFLEX MICROSCOPIC - Abnormal; Notable for the following:    Color, Urine AMBER (*)    APPearance CLOUDY (*)    Specific Gravity, Urine 1.037 (*)    Hgb urine dipstick LARGE (*)  Bilirubin Urine SMALL (*)    Ketones, ur 15 (*)    Protein,  ur >300 (*)    Nitrite POSITIVE (*)    Leukocytes, UA MODERATE (*)    All other components within normal limits  URINE MICROSCOPIC-ADD ON - Abnormal; Notable for the following:    Bacteria, UA MANY (*)    All other components within normal limits  URINE CULTURE   Imaging Review No results found.  EKG Interpretation   None      Foley catheter replaced.  Cloudy urine noted and sent for analysis. MDM   1. UTI (urinary tract infection)    Ua suggests infection vs colinization however pt is having symptoms.   Will dc back to nursing facility on oral abx.    Celene Kras, MD 02/15/13 [phone]

## 2013-02-15 NOTE — ED Notes (Signed)
Pt arrived to ED via EMS from [facility]  with a complaint of penis pain associated with a foley catheter.  Pt has had previous problem with these particular symptoms.  Per EMS the Nursing facility stated the patient began complaining around 2300hrs yesterday.  Pt has a history cerebal palsy and is a quadriplegic.  Pt has a foley catheter in place and has a history of UTI's.

## 2013-02-15 NOTE — ED Notes (Signed)
Bed: ZO10 Expected date: 02/15/13 Expected time: 2:33 AM Means of arrival: Ambulance Comments: Bed 13, EMS, 30 M, Catheter Pain

## 2013-02-16 LAB — URINE CULTURE: Colony Count: >=100000

## 2013-03-16 ENCOUNTER — Non-Acute Institutional Stay (SKILLED_NURSING_FACILITY): Payer: Medicare Other | Admitting: Internal Medicine

## 2013-03-16 DIAGNOSIS — R05 Cough: Secondary | ICD-10-CM

## 2013-03-16 DIAGNOSIS — K219 Gastro-esophageal reflux disease without esophagitis: Secondary | ICD-10-CM

## 2013-03-16 DIAGNOSIS — E860 Dehydration: Secondary | ICD-10-CM

## 2013-03-16 DIAGNOSIS — R059 Cough, unspecified: Secondary | ICD-10-CM

## 2013-03-16 DIAGNOSIS — G808 Other cerebral palsy: Secondary | ICD-10-CM

## 2013-03-16 NOTE — Progress Notes (Signed)
Patient ID: [name], male   DOB: 1927-09-29, 79 y.o.   MRN: [id number] Facility; [facility] Chief complaint; admission to skilled nursing from [facility] assisted living. Previous to this roughly 2 months ago the patient was a long-standing resident of [facility] for many years History; as stated above this patient is [facility] to skilled care. Presumably he was beyond her level of care. Previously he was at [facility] for over 30 years according to him. Does not appear to be anything acute going on a. The patient is able to tell me that roughly 2-3 years ago he was able to walk. He did does not appear that he has had any major hospitalizations. The exact nature and course of his deterioration is not clear to me at this time  He appears to a been anconeus are at the end of November 2014. It would appear that he was felt to have a UTI although his culture ultimately showed multiple bacterial morphotypes. Lab work has shown below  Past Medical History  Diagnosis Date  . Hypertension   . Cerebral palsy   . UTI (lower urinary tract infection) with chronic indwelling catheter    . Pneumonia   . Esophageal disorder with erosive esophagitis and Barrett's esophagus    . GERD (gastroesophageal reflux disease)   . Quadriplegia Barrett's esophagus  Dysphagia with chronic aspiration Recurrent aspiration pneumonia     Current medications; clonidine 0.1 mg patch weekly , stool softener daily, omeprazole 40 mg one by mouth a.c. breakfast, melatonin 1 mg one by mouth each bedtime, finasteride 5 mg one by mouth daily, lisinopril 5 mg one by mouth daily, Tussend 2 teaspoons by mouth 3 times a day, MiraLax 17 g daily, albuterol nebulizers 4 times a day, Tylenol 325 2 by mouth 4 times a day when necessary pain, milk of magnesia 2 tablespoons daily when necessary, trazodone 50 mg each bedtime when necessary sleep lidocaine 2% viscous solution applied to  the penis every 2 hours when necessary discomfort from chronic Foley catheter  Social history; he apparently is in no code although I don't see this documention. Was at [facility] for 35 years until roughly 2-3 months ago transferred to [facility] retirement. I suspect the issue here was he was beyond assisted living level of care and probably has been for some period of time. He is now transferred to skilled care. Suspect he is completely dependent for ADLs. As mentioned he is a chronic Foley catheter  Family history; is not currently available  Review of systems; difficult from the patient due to some degree of dysarthria Gen. patient states he "does not eat much" Respiratory chronic cough on 3 times a day Tussin. May have postnasal drip and chronic gastroesophageal reflux Cardiac no clear chest pain GI; suspect chronic constipation. Fecal incontinence GU chronic Foley Presumably due to BPH Musculoskeletal; complains of low back and right hip pain  Physical examination Gen. very frail man with severe flexion posturing of his upper arms. Neck is extended and rotated to the left.  Vitals respiratory rate 16 pulse rate 70. HEENT dry mucous membranes no oral lesions are seen Respiratory shallow but otherwise clear entry bilaterally there are no crackles or wheezes work her breathing is normal. Probably some degree of dehydration Cardiac heart sounds are normal no murmurs no gallops and no carotid bruits Abdomen; L. sounds are positive there is no liver or spleen no tenderness. There is a linear fullness in  the left upper quadrant towards the left mid and left lower quadrant. This is not particularly tender is nonpulsatile. This feels like distended colon Rectal; scant amount of old been negative stool. Prostate feels normal Neurologically; this is a very frail man. His head is extended and drawn to the left. He can speak although his speech is difficult to understand due to dysarthria. I suspect  he is cognitively intact. He has some use of his left hand nothing in the right arm both arms are cold and the tight contractures. He has minimal movement in the left leg not the right although we appears to be able to withdraw on both sides. Reflexes are symmetrically brisk at the knee jerks 3+ bilaterally both plantar responses are flexor however.  Mental status; as noted above I believe this man is cognitively intact. He does not appear to be delirious or depressed  Impression/plan 1#Cerebral palsy with functional quadriplegia at this point. Disability is on no The exdisability is unknown disability is unknown. There were intercurrent conditions or coexisting conditions contributing to this it is not clear at the bedside nor from a review of [place] link 2#probable progressive failure to thrive largely secondary to his neurologic disability. I wouldn't be surprised if there is significant weight loss, protein calorie malnutrition and dehydration #3 chronic cough; his is probably secondary to chronic severe gastroesophageal reflux with concomitant Barrett esophagus, erosive esophagitis. One would hope that they ACE-induced cough was not overlooked #4 probable weight loss and dehydratio #5 apparent recurrent UTIs he has a chronic Foley catheter. The reason for the Foley catheter may have something to do with the underlying cerebral palsy. He does not appear to have a severely enlarged prostate although he is on finasteride.  #6 abdominal mass in the left upper and mid quadrant. This feels like bowel it is not pulsatile and does not appear to be tender. He does not have a distal impaction. In my review of [place] link there was some reference to a recent colonoscopy however when I attempted to draw this up the only reference was a reference to his ER visit last month. I would question error.  I suspect this is going to be a difficult situation. I will immediately check his nutrition and hydration  status. Have spoken to the staff he will need restorative feeding and hydration. He does not have any pressure areas however he is at extreme risk of this secondary to his immobility, malnutrition etc.    CT HEAD WITHOUT CONTRAST    Technique:  Contiguous axial images were obtained from the base of the skull through the vertex without contrast.    Comparison: 08/12/2007.  06/16/2004.    Findings: There is moderate generalized brain atrophy.  There are chronic small vessel changes within the hemispheric deep white matter.  No evidence of acute infarction, mass lesion, hemorrhage, hydrocephalus or extra-axial collection.  No significant sinus disease.  Mastoids are clear.  No calvarial abnormality.    IMPRESSION: Atrophy and chronic small vessel disease.  No acute or reversible process.  No apparent change when compared to previous examinations.   Provider: Berton Bon     Results for [name], [name] (MRN [id number]) as of 03/16/2013 09:10  Ref. Range 01/25/2013 23:46 01/26/2013 00:54 02/15/2013 03:30 02/15/2013 05:22  Sodium Latest Range: 135-145 mEq/L   130 (L)   Potassium Latest Range: 3.5-5.1 mEq/L   4.5   Chloride Latest Range: 96-112 mEq/L   94 (L)   CO2 Latest Range: 19-32  mEq/L   24   BUN Latest Range: 6-23 mg/dL   18   Creatinine Latest Range: 0.50-1.35 mg/dL 1.61  0.96   Calcium Latest Range: 8.4-10.5 mg/dL   9.4   GFR calc non Af Amer Latest Range: >90 mL/min   >90   GFR calc Af Amer Latest Range: >90 mL/min   >90   Glucose Latest Range: 70-99 mg/dL   045 (H)   WBC Latest Range: 4.0-10.5 K/uL   13.5 (H)   RBC Latest Range: 4.22-5.81 MIL/uL   4.38   Hemoglobin Latest Range: 13.0-17.0 g/dL   40.9 (L)   HCT Latest Range: 39.0-52.0 %   36.7 (L)   MCV Latest Range: 78.0-100.0 fL   83.8   MCH Latest Range: 26.0-34.0 pg   28.1   MCHC Latest Range: 30.0-36.0 g/dL   81.1   RDW Latest Range: 11.5-15.5 %   15.5   Platelets Latest Range: 150-400 K/uL   286   Neutrophils  Relative % Latest Range: 43-77 %   86 (H)   Lymphocytes Relative Latest Range: 12-46 %   8 (L)   Monocytes Relative Latest Range: 3-12 %   5   Eosinophils Relative Latest Range: 0-5 %   0   Basophils Relative Latest Range: 0-1 %   0   NEUT# Latest Range: 1.7-7.7 K/uL   11.6 (H)   Lymphocytes Absolute Latest Range: 0.7-4.0 K/uL   1.1   Monocytes Absolute Latest Range: 0.1-1.0 K/uL   0.7   Eosinophils Absolute Latest Range: 0.0-0.7 K/uL   0.0   Basophils Absolute Latest Range: 0.0-0.1 K/uL   0.0   Color, Urine Latest Range: YELLOW   YELLOW  AMBER (A)  APPearance Latest Range: CLEAR   CLOUDY (A)  CLOUDY (A)  Specific Gravity, Urine Latest Range: 1.005-1.030   1.017  1.037 (H)  pH Latest Range: 5.0-8.0   7.0  7.0  Glucose Latest Range: NEGATIVE mg/dL  NEGATIVE  NEGATIVE  Bilirubin Urine Latest Range: NEGATIVE   NEGATIVE  SMALL (A)  Ketones, ur Latest Range: NEGATIVE mg/dL  NEGATIVE  15 (A)  Protein Latest Range: NEGATIVE mg/dL  NEGATIVE  >914 (A)  Urobilinogen, UA Latest Range: 0.0-1.0 mg/dL  0.2  1.0  Nitrite Latest Range: NEGATIVE   POSITIVE (A)  POSITIVE (A)  Leukocytes, UA Latest Range: NEGATIVE   LARGE (A)  MODERATE (A)  Hgb urine dipstick Latest Range: NEGATIVE   NEGATIVE  LARGE (A)  WBC, UA Latest Range: <3 WBC/hpf  TOO NUMEROUS TO COUNT  TOO NUMEROUS TO COUNT  RBC / HPF Latest Range: <3 RBC/hpf    TOO NUMEROUS TO COUNT  Squamous Epithelial / LPF Latest Range: RARE   RARE    Bacteria, UA Latest Range: RARE   MANY (A)  MANY (A)  URINE CULTURE No range found  Rpt  Rpt

## 2013-03-26 ENCOUNTER — Other Ambulatory Visit (HOSPITAL_COMMUNITY): Payer: Self-pay | Admitting: Internal Medicine

## 2013-03-26 DIAGNOSIS — R1310 Dysphagia, unspecified: Secondary | ICD-10-CM

## 2013-04-05 ENCOUNTER — Other Ambulatory Visit (HOSPITAL_COMMUNITY): Payer: Medicare Other

## 2013-04-05 ENCOUNTER — Ambulatory Visit (HOSPITAL_COMMUNITY): Payer: Medicare Other

## 2013-04-06 ENCOUNTER — Non-Acute Institutional Stay (SKILLED_NURSING_FACILITY): Payer: Medicare Other | Admitting: Internal Medicine

## 2013-04-06 DIAGNOSIS — M25551 Pain in right hip: Secondary | ICD-10-CM

## 2013-04-06 DIAGNOSIS — E871 Hypo-osmolality and hyponatremia: Secondary | ICD-10-CM

## 2013-04-06 DIAGNOSIS — E440 Moderate protein-calorie malnutrition: Secondary | ICD-10-CM

## 2013-04-06 DIAGNOSIS — M25559 Pain in unspecified hip: Secondary | ICD-10-CM

## 2013-04-06 NOTE — Progress Notes (Signed)
Patient ID: [name], male   DOB: 1928-02-23, 80 y.o.   MRN: [id number] [facility] Chief complaint; followup admission 2 to 3 weeks ago History; I am following up today on my admission to this gentleman from 12/23. He was a gentleman who was [facility]. [facility] [place] [facility] [place] [facility]. It would appear that there has been some functional decline over the last 3 years or so however the exact nature of this and its progression is unclear. The patient has chronic severe cerebral palsy with functional quadriplegia. I was concerned when I saw this man for his admission history and physical about possible dehydration, protein calorie malnutrition. His lab work from 12/24 showed a white count of 5.4 a hemoglobin of 11.3 with normal indices his platelet count is 239. Sodium 129 BUN of 7 creatinine of 0.38 and an albumin of 3.2.   Review of systems;  Respiratory; no shortness of breath Cardiac no clear chest pain GI; patient has severe gastroesophageal reflux a history of erosive esophagitis and Barrett's esophagus nevertheless he does not appear to have dysphagia GU; Foley catheter in place Musculoskeletal plains of right hip pain  Physical examination Gen. Frail-appearing man. Arms are held in a flexed posture neck extended and pulled to the left Respiratory; air entry shallow but clear Cardiac still appears to be on the volume contracted side otherwise heart sounds are normal no murmurs Abdomen; the liver spleen or masses Right hip; he does not appear to be in pain with internal and external rotation although her there is chronic flexion contractures of the right hip and knee. He is somewhat tender over the greater trochanter although this does not appear to be elicited severe pain  Impression/plan #1 severe cerebral palsy with severe neurologic compromise presumably all  secondary to this.  #2 severe gastroesophageal reflux although he does not appear to be that symptomatic from this #3 hyponatremia, he does appear volume contracted he is not on anything that really should be contributing to this. I will repeat this #4 moderate protein calorie malnutrition reflected by an albumin of 3.2. It is worthwhile recalling that any deficits of the albumin from intake issues already reflects moderate protein calorie malnutrition.  #5 right hip pain. I will get an x-ray here this may reflect trochanteric bursitis. I don't see any pressure areas to the hip although that would also need to be carefully watched. He is certainly at risk for pressure related to skin and soft tissue injury. I don't believe he really asks for pain medications therefore I may need to give him something routinely

## 2013-04-08 ENCOUNTER — Ambulatory Visit (HOSPITAL_COMMUNITY)
Admission: RE | Admit: 2013-04-08 | Discharge: 2013-04-08 | Disposition: A | Discharge status: Home / Self Care | Payer: Medicare Other | Source: Ambulatory Visit | Attending: Internal Medicine | Admitting: Internal Medicine

## 2013-04-08 DIAGNOSIS — R1310 Dysphagia, unspecified: Secondary | ICD-10-CM

## 2013-04-08 NOTE — Procedures (Signed)
Objective Swallowing Evaluation: Modified Barium Swallowing Study  Patient Details  Name: Marin OlpCharles W Steller MRN: [id number] Date of Birth: 08-29-1927  Today's Date: 04/08/2013 Time: 1420-1500 SLP Time Calculation (min): 40 min  Past Medical History:  Past Medical History  Diagnosis Date  . Hypertension   . Cerebral palsy   . UTI (lower urinary tract infection)   . Pneumonia   . Esophageal disorder   . GERD (gastroesophageal reflux disease)   . Quadriplegia    Past Surgical History: No past surgical history on file. HPI:  pt is an 80 yo male resident of SNF referred for MBS.  Pt PMH + for HTN, cerebral palsy, UTI, pna, esophageal disorder with erosive esophagitis and Barrett's, GERD, chronic aspiration, recurrent asp pnas, functional quadreplegia.  Pt had [facility] at [facility] for [facility] years until they closed.       Assessment / Plan / Recommendation Clinical Impression  Dysphagia Diagnosis: Severe oral phase dysphagia;Severe pharyngeal phase dysphagia;Severe cervical esophageal phase dysphagia   Clinical impression: Severe oropharyngeal and cervical esophageal dysphagia with very poor muscular contraction.  Delayed oral transiting noted with pt essentially spilling boluses into pharynx uncontrolled.  Nearly absent muscular contraction noted.  In addition, pt with severe weakness resulting in GROSS pharyngeal stasis - pt did not sense stasis adequately and cued dry swallows ineffective to clear.  SLP instructed pt to hock to attempt to clear stasis - also ineffective.  Pt given thin water via tsp to aid clearance.    Pt is a high aspiration risk with any po intake, however note he has undergone previous MBS studies in 2009 and 2011 indicating severe dysphagia and pt has continued to eat.  Pt also with secretions retained in pharynx that he likely aspirates.  If pt were to pursue feeding tube, given his h/o Barrett's esophagus and GERD, he may aspirate tube feeds or reflux and his QOL may  be negatively impacted.     Defer to pt/SNF SLP/MD to determine plan of care.  Pt educated to findings, concerns, and required frequent oral suctioning during testing.  Thanks for this referral.      Treatment Recommendation    defer to SNF SLP   Diet Recommendation  (defer to MD and SNF SLP)                   General Date of Onset: 04/08/13 HPI: pt is an 80 yo male resident of SNF referred for MBS.  Pt PMH + for HTN, cerebral palsy, UTI, pna, esophageal disorder with erosive esophagitis and Barrett's, GERD, chronic aspiration, recurrent asp pnas, functional quadreplegia.  [facility] until they closed.   Type of Study: Modified Barium Swallowing Study Reason for Referral: Objectively evaluate swallowing function Diet Prior to this Study:  (unknown info not available) Temperature Spikes Noted: N/A Respiratory Status: Room air History of Recent Intubation: No Behavior/Cognition: Alert;Cooperative Oral Cavity - Dentition: Adequate natural dentition Oral Motor / Sensory Function: Impaired sensory;Impaired motor Oral impairment:  (pt with cerebral palsy- dried secretions on hard palate - removed with oral care/suction) Self-Feeding Abilities: Total assist Patient Positioning: Postural control interferes with function Baseline Vocal Quality: Low vocal intensity Volitional Cough: Weak Volitional Swallow: Unable to elicit Pharyngeal Secretions: Standing secretions in (comment) (see above)    Reason for Referral Objectively evaluate swallowing function   Oral Phase Oral Preparation/Oral Phase Oral Phase: Impaired Oral - Nectar Oral - Nectar Teaspoon: Delayed oral transit;Reduced posterior propulsion;Lingual/palatal residue;Weak  lingual manipulation;Piecemeal swallowing Oral - Thin Oral - Thin Teaspoon: Delayed oral transit;Reduced posterior propulsion;Lingual/palatal residue;Weak lingual manipulation;Piecemeal swallowing Oral - Solids Oral - Puree:  Delayed oral transit;Reduced posterior propulsion;Lingual/palatal residue;Weak lingual manipulation;Piecemeal swallowing Oral Phase - Comment Oral Phase - Comment: pt with head elevated   Pharyngeal Phase Pharyngeal Phase Pharyngeal Phase: Impaired Pharyngeal - Nectar Pharyngeal - Nectar Teaspoon: Reduced pharyngeal peristalsis;Reduced epiglottic inversion;Reduced anterior laryngeal mobility;Reduced laryngeal elevation;Reduced airway/laryngeal closure;Reduced tongue base retraction;Pharyngeal residue - valleculae;Pharyngeal residue - pyriform sinuses;Pharyngeal residue - cp segment;Premature spillage to valleculae;Premature spillage to pyriform sinuses;Delayed swallow initiation Pharyngeal - Thin Pharyngeal - Thin Teaspoon: Reduced pharyngeal peristalsis;Reduced epiglottic inversion;Reduced anterior laryngeal mobility;Reduced laryngeal elevation;Reduced airway/laryngeal closure;Reduced tongue base retraction;Pharyngeal residue - valleculae;Pharyngeal residue - pyriform sinuses;Pharyngeal residue - cp segment;Premature spillage to valleculae;Premature spillage to pyriform sinuses;Delayed swallow initiation Pharyngeal - Solids Pharyngeal - Puree: Premature spillage to valleculae;Delayed swallow initiation;Reduced pharyngeal peristalsis;Reduced epiglottic inversion;Reduced anterior laryngeal mobility;Reduced laryngeal elevation;Reduced airway/laryngeal closure;Reduced tongue base retraction;Pharyngeal residue - pyriform sinuses;Pharyngeal residue - valleculae;Pharyngeal residue - posterior pharnyx Pharyngeal Phase - Comment Pharyngeal Comment: pt required several swallow across consisencies to attempt to clear pharynx, unsuccessful to clear, cues to attempt to "hock" and expectorate largely ineffective  Cervical Esophageal Phase    GO    Cervical Esophageal Phase Cervical Esophageal Phase: Impaired Cervical Esophageal Phase - Nectar Nectar Teaspoon: Reduced cricopharyngeal relaxation Cervical  Esophageal Phase - Thin Thin Teaspoon: Reduced cricopharyngeal relaxation Cervical Esophageal Phase - Solids Puree: Reduced cricopharyngeal relaxation Cervical Esophageal Phase - Comment Cervical Esophageal Comment: Very poor UES opening resulting in gross pyriform/pharyngeal stasis WITHOUT pt awareness, very audible swallow noted when pt able to swallow    Functional Assessment Tool Used: mbs, clinical judgement Functional Limitations: Swallowing Swallow Current Status (W0981): At least 80 percent but less than 100 percent impaired, limited or restricted Swallow Goal Status [phone]): At least 80 percent but less than 100 percent impaired, limited or restricted Swallow Discharge Status [phone]): At least 80 percent but less than 100 percent impaired, limited or restricted    Mills Koller, MS [facility] SLP [phone]

## 2013-04-13 ENCOUNTER — Ambulatory Visit (HOSPITAL_COMMUNITY): Payer: Medicare Other

## 2013-04-13 ENCOUNTER — Other Ambulatory Visit (HOSPITAL_COMMUNITY): Payer: Medicare Other

## 2013-04-27 ENCOUNTER — Non-Acute Institutional Stay (SKILLED_NURSING_FACILITY): Payer: Medicare Other | Admitting: Internal Medicine

## 2013-04-27 DIAGNOSIS — E871 Hypo-osmolality and hyponatremia: Secondary | ICD-10-CM

## 2013-04-27 DIAGNOSIS — E440 Moderate protein-calorie malnutrition: Secondary | ICD-10-CM

## 2013-04-27 NOTE — Progress Notes (Signed)
Patient ID: Victor OlpCharles W Dotter, male   DOB: 10/25/1927, 80 y.o.   MRN: [id number] Facility [facility] Chief complaint; dysphagia History; this is a patient who came to the facility transferred from an assisted living. Prior to this [facility] at the [facility] apparent he has advanced cerebral palsy the exact history which I am not completely aware. As mentioned he was only admitted to the building roughly a month or so ago.  I was called last week to report her request for a feeding tube. I was not prepared to agree to this over the phone with actually without actually seeing the patient. He apparently had a swallowing evaluation done in the hospital I am not able to get that result. However clinically he has an albumin of 3.2. His weight is very between 85 and 88 pounds with no clear trend in either direction. He has not had clinical aspiration pneumonitis as far as I am aware. Nevertheless I witnessed him trying to eat in the dining room in the facility today. It is really a difficult scenario to observe for. The patient literally cannot stop coughing with Specter ration of some of his food both orally and probably nasally. Patient tells me he is exhausted by the effort of eating and swallowing. He is actually requesting a feeding tube as he finds the overall effort of eating to be too taxing.   Physical examination Respiratory he is not frankly aspirating as far as my stethoscope was able to determine however once again the issue here appears to be more of a patient comfort issue.  Impression/plan #1 moderate protein calorie malnutrition. The patient and his family are requesting an alternative feeding mechanism presumably through a PEG. I am not certain if this can be put in in interventional radiology or not #2 history of significant gastroesophageal reflux I don't think this is the only issue this patient is going through a. He appears to have severe oropharyngeal dysphagia as well. #3  hyponatremia his sodium as measured on 12/24 was 129 he is not on a diuretic he appears to be euvolemic I will recheck this

## 2013-04-28 ENCOUNTER — Other Ambulatory Visit (HOSPITAL_BASED_OUTPATIENT_CLINIC_OR_DEPARTMENT_OTHER): Payer: Self-pay | Admitting: Internal Medicine

## 2013-04-28 ENCOUNTER — Other Ambulatory Visit: Payer: Self-pay | Admitting: Radiology

## 2013-04-28 ENCOUNTER — Other Ambulatory Visit (HOSPITAL_COMMUNITY): Payer: Self-pay | Admitting: Radiology

## 2013-04-28 DIAGNOSIS — R633 Feeding difficulties, unspecified: Secondary | ICD-10-CM

## 2013-04-29 ENCOUNTER — Encounter (HOSPITAL_COMMUNITY): Payer: Self-pay

## 2013-04-29 ENCOUNTER — Ambulatory Visit (HOSPITAL_COMMUNITY)
Admission: RE | Admit: 2013-04-29 | Discharge: 2013-04-29 | Disposition: A | Discharge status: Home / Self Care | Payer: Medicare Other | Source: Ambulatory Visit | Attending: Internal Medicine | Admitting: Internal Medicine

## 2013-04-29 ENCOUNTER — Ambulatory Visit (HOSPITAL_COMMUNITY)
Admission: RE | Admit: 2013-04-29 | Discharge: 2013-04-29 | Disposition: A | Discharge status: Home / Self Care | Payer: Medicare Other | Source: Ambulatory Visit | Attending: Diagnostic Radiology | Admitting: Diagnostic Radiology

## 2013-04-29 ENCOUNTER — Ambulatory Visit (HOSPITAL_COMMUNITY): Payer: Medicare Other

## 2013-04-29 DIAGNOSIS — E46 Unspecified protein-calorie malnutrition: Secondary | ICD-10-CM | POA: Insufficient documentation

## 2013-04-29 DIAGNOSIS — R633 Feeding difficulties, unspecified: Secondary | ICD-10-CM | POA: Insufficient documentation

## 2013-04-29 DIAGNOSIS — I10 Essential (primary) hypertension: Secondary | ICD-10-CM | POA: Insufficient documentation

## 2013-04-29 DIAGNOSIS — R1310 Dysphagia, unspecified: Secondary | ICD-10-CM | POA: Insufficient documentation

## 2013-04-29 DIAGNOSIS — N281 Cyst of kidney, acquired: Secondary | ICD-10-CM | POA: Insufficient documentation

## 2013-04-29 DIAGNOSIS — K219 Gastro-esophageal reflux disease without esophagitis: Secondary | ICD-10-CM | POA: Insufficient documentation

## 2013-04-29 DIAGNOSIS — G808 Other cerebral palsy: Secondary | ICD-10-CM | POA: Insufficient documentation

## 2013-04-29 DIAGNOSIS — Z79899 Other long term (current) drug therapy: Secondary | ICD-10-CM | POA: Insufficient documentation

## 2013-04-29 MED ORDER — FENTANYL CITRATE 0.05 MG/ML IJ SOLN
INTRAMUSCULAR | Status: AC
  Filled 2013-04-29: qty 4

## 2013-04-29 MED ORDER — SODIUM CHLORIDE 0.9 % IV SOLN
Freq: Once | INTRAVENOUS | Status: AC
  Administered 2013-04-29: 09:00:00 via INTRAVENOUS

## 2013-04-29 MED ORDER — FENTANYL CITRATE 0.05 MG/ML IJ SOLN
INTRAMUSCULAR | Status: AC | PRN
  Administered 2013-04-29 (×2): 25 ug via INTRAVENOUS

## 2013-04-29 MED ORDER — MIDAZOLAM HCL 2 MG/2ML IJ SOLN
INTRAMUSCULAR | Status: AC
  Filled 2013-04-29: qty 4

## 2013-04-29 MED ORDER — LIDOCAINE HCL 1 % IJ SOLN
INTRAMUSCULAR | Status: AC
  Filled 2013-04-29: qty 20

## 2013-04-29 MED ORDER — CEFAZOLIN SODIUM-DEXTROSE 2-3 GM-% IV SOLR
2.0000 g | Freq: Once | INTRAVENOUS | Status: AC
  Administered 2013-04-29: 2 g via INTRAVENOUS
  Filled 2013-04-29: qty 50

## 2013-04-29 MED ORDER — HYDROCODONE-ACETAMINOPHEN 5-325 MG PO TABS: 1.0000 | ORAL_TABLET | ORAL | Status: DC | PRN

## 2013-04-29 NOTE — Progress Notes (Signed)
Patient has productive congested cough. Coronda, LPN from [facility], verified he had been NPO since MN and that contrast was given 04-28-2013 from 1900 to 2100 . Labs drawn and results sent from [facility] w patient

## 2013-04-29 NOTE — Discharge Instructions (Signed)
Gastrostomy Tube Home Guide °A gastrostomy tube is a tube that is surgically placed into the stomach. It is also called a "G-tube." G-tubes are used when a person is unable to eat and drink enough on their own to stay healthy. The tube is inserted into the stomach through a small cut (incision) in the skin. This tube is used for: °· Feeding. °· Giving medication. °GASTROSTOMY TUBE CARE °· Wash your hands with soap and water. °· Remove the old dressing (if any). Some styles of G-tubes may need a dressing inserted between the skin and the G-tube. Other types of G-tubes do not require a dressing. Ask your health care provider if a dressing is needed. °· Check the area where the tube enters the skin (insertion site) for redness, swelling, or pus-like (purulent) drainage. A small amount of clear or tan liquid drainage is normal. Check to make sure scar tissue (skin) is not growing around the insertion site. This could have a raised, bumpy appearance. °· A cotton swab can be used to clean the skin around the tube: °· When the G-tube is first put in, a normal saline solution or water can be used to clean the skin. °· Mild soap and warm water can be used when the skin around the G-tube site has healed. °· Roll the cotton swab around the G-tube insertion site to remove any drainage or crusting at the insertion site. °STOMACH RESIDUALS °Feeding tube residuals are the amount of liquids that are in the stomach at any given time. Residuals may be checked before giving feedings, medications, or as instructed by your health care provider. °· Ask your health care provider if there are instances when you would not start tube feedings depending on the amount or type of contents withdrawn from the stomach. °· Check residuals by attaching a syringe to the G-tube and pull back on the syringe plunger. Note the amount and return the residual back into the stomach. °FLUSHING THE G-TUBE °· The G-tube should be periodically flushed with  clean warm water to keep it from clogging. °· Flush the G-tube after feedings or medications. Draw up 30 mL of warm water in a syringe. Connect the syringe to the G-tube and slowly push the water into the tube. °· Do not push feedings, medications, or flushes rapidly. Flush the G-tube gently and slowly. °· Only use syringes made for G-tubes to flush medications or feedings. °· Your health care provider may want the G-tube flushed more often or with more water. If this is the case, follow your health care provider's instructions. °FEEDINGS °Your health care provider will determine whether feedings are given as a bolus (a certain amount given at one time and at scheduled times) or whether feedings will be given continuously on a feeding pump.  °· Formulas should be given at room temperature. °· If feedings are continuous, no more than 4 hours worth of feedings should be placed in the feeding bag. This helps prevent spoilage or accidental excess infusion. °· Cover and place unused formula in the refrigerator. °· If feedings are continuous, stop the feedings when medications or flushes are given. Be sure to restart the feedings. °· Feeding bags and syringes should be replaced as instructed by your health care provider. °GIVING MEDICATION  °· In general, it is best if all medications are in a liquid form for G-tube administration. Liquid medications are less likely to clog the G-tube. °· Mix the liquid medication with 30 mL (or amount recommended by your   health care provider) of warm water. °· Draw up the medication into the syringe. °· Attach the syringe to the G-tube and slowly push the mixture into the G-tube. °· After giving the medication, draw up 30 mL of warm water in the syringe and slowly flush the G-tube. °· For pills or capsules, check with your health care provider first before crushing medications. Some pills are not effective if they are crushed. Some capsules are sustained release medications. °· If  appropriate, crush the pill or capsule and mix with 30 mL of warm water. Using the syringe, slowly push the medication through the tube, then flush the tube with another 30 mL of tap water. °G-TUBE PROBLEMS °G-tube was pulled out. °· Cause: May have been pulled out accidentally. °· Solutions: Cover the opening with clean dressing and tape. Call your health care provider right away. The G-tube should be put in as soon as possible (within 4 hours) so the G-tube opening (tract) does not close. The G-tube needs to be put in at a healthcare setting. An X-ray needs to be done to confirm placement before the G-tube can be used again. °Redness, irritation, soreness, or foul odor around the gastrostomy site. °· Cause: May be caused by leakage or infection. °· Solutions: Call your health care provider right away. °Large amount of leakage of fluid or mucus-like liquid present (a large amount means it soaks clothing). °· Cause: Many reasons could cause the G-tube to leak. °· Solutions: Call your health care provider to discuss the amount of leakage. °Skin or scar tissue appears to be growing where tube enters skin.  °· Cause: Tissue growth may develop around the insertion site if the G-tube is moved or pulled on excessively. °· Solutions: Secure tube with tape so that excess movement does not occur. Call your health care provider. °G-tube is clogged. °· Cause: Thick formula or medication. °· Solutions: Try to slowly push warm water into the tube with a large syringe. Never try to push any object into the tube to unclog it. Do not force fluid into the G-tube. If you are unable to unclog the tube, call your health care provider right away. °TIPS °· Head of Bed (HOB) position refers to the upright position of a person's upper body. °· When giving medications or a feeding bolus, keep the HOB up as told by your health care provider. Do this during the feeding and for 1 hour after the feeding or medication administration. °· If  continuous feedings are being given, it is best to keep the HOB up as told by your health care provider. When ADLs (Activities of Daily Living) are performed and the HOB needs to be flat, be sure to turn the feeding pump off. Restart the feeding pump when the HOB is returned to the recommended height. °· Do not pull or put tension on the tube. °· To prevent fluid backflow, kink the G-tube before removing the cap or disconnecting a syringe. °· Check the G-tube length every day. Measure from the insertion site to the end of the G-tube. If the length is longer than previous measurements, the tube may be coming out. Call your health care provider if you notice increasing G-tube length. °· Oral care, such as brushing teeth, must be continued. °· You may need to remove excess air (vent) from the G-tube. Your health care provider will tell you if this is needed. °· Always call your health care provider if you have questions or problems with the G-tube. °  SEEK IMMEDIATE MEDICAL CARE IF:  °· You have severe abdominal pain, tenderness, or abdominal bloating (distension). °· You have nausea or vomiting. °· You are constipated or have problems moving your bowels. °· The G-tube insertion site is red, swollen, has a foul smell, or has yellow or brown drainage. °· You have difficulty breathing or shortness of breath. °· You have a fever. °· You have a large amount of feeding tube residuals. °· The G-tube is clogged and cannot be flushed. °MAKE SURE YOU:  °· Understand these instructions. °· Will watch your condition. °· Will get help right away if you are not doing well or get worse. °Document Released: 05/20/2001 Document Revised: 12/30/2012 Document Reviewed: 11/16/2012 °Exit[employer]® Patient Information ©2014 [employer]. °Moderate Sedation, Adult °Moderate sedation is given to help you relax or even sleep through a procedure. You may remain sleepy, be clumsy, or have poor balance for several hours following this procedure.  Arrange for a responsible adult, family member, or friend to take you home. A responsible adult should stay with you for at least 24 hours or until the medicines have worn off. °· Do not participate in any activities where you could become injured for the next 24 hours, or until you feel normal again. Do not: °· Drive. °· Swim. °· Ride a bicycle. °· Operate heavy machinery. °· Cook. °· Use power tools. °· Climb ladders. °· Work at heights. °· Do not make important decisions or sign legal documents until you are improved. °· Vomiting may occur if you eat too soon. When you can drink without vomiting, try water, juice, or soup. Try solid foods if you feel little or no nausea. °· Only take over-the-counter or prescription medications for pain, discomfort, or fever as directed by your caregiver.If pain medications have been prescribed for you, ask your caregiver how soon it is safe to take them. °· Make sure you and your family fully understands everything about the medication given to you. Make sure you understand what side effects may occur. °· You should not drink alcohol, take sleeping pills, or medications that cause drowsiness for at least 24 hours. °· If you smoke, do not smoke alone. °· If you are feeling better, you may resume normal activities 24 hours after receiving sedation. °· Keep all appointments as scheduled. Follow all instructions. °· Ask questions if you do not understand. °SEEK MEDICAL CARE IF:  °· Your skin is pale or bluish in color. °· You continue to feel sick to your stomach (nauseous) or throw up (vomit). °· Your pain is getting worse and not helped by medication. °· You have bleeding or swelling. °· You are still sleepy or feeling clumsy after 24 hours. °SEEK IMMEDIATE MEDICAL CARE IF:  °· You develop a rash. °· You have difficulty breathing. °· You develop any type of allergic problem. °· You have a fever. °Document Released: 12/04/2000 Document Revised: 06/03/2011 Document Reviewed:  11/16/2012 °ExitCare® Patient Information ©2014 [employer]. ° °

## 2013-04-29 NOTE — H&P (Signed)
HPI: [name] is an 80 y.o. male with quadriplegia and cerebral palsy, who has progressive dysphagia and protein calorie malnutrition. Per his brother, he had been able to eat, but has become weaker. He is referred for gastrostomy tube placement. Per referring facility, he did drink the barium contrast last night. He has had wet sounding cough for the past days to week, but no known fever, no documented pneumonia. Chart, and med list from facility reviewed.  Past Medical History:  Past Medical History  Diagnosis Date  . Hypertension   . Cerebral palsy   . UTI (lower urinary tract infection)   . Pneumonia   . Esophageal disorder   . GERD (gastroesophageal reflux disease)   . Quadriplegia     Past Surgical History:  Past Surgical History  Procedure Laterality Date  . Hand surgery  as a child    multiple surgeries to correct contratures  . Urolpgy      several urology procedures per brother    Family History: History reviewed. No pertinent family history.  Social History:  reports that he has never smoked. He does not have any smokeless tobacco history on file. He reports that he does not drink alcohol or use illicit drugs.  Allergies: No Known Allergies  Medications:   Medication List    ASK your doctor about these medications       acetaminophen 500 MG tablet  Commonly known as:  TYLENOL  Take 500 mg by mouth 2 (two) times daily.     cloNIDine 0.1 mg/24hr patch  Commonly known as:  CATAPRES - Dosed in mg/24 hr  Place 1 patch onto the skin once a week. On Tuesdays     docusate calcium 240 MG capsule  Commonly known as:  SURFAK  Take 240 mg by mouth every morning.     feeding supplement (ENSURE) Pudg  Take 1 Container by mouth 3 (three) times daily between meals.     finasteride 5 MG tablet  Commonly known as:  PROSCAR  Take 5 mg by mouth every morning.     guaifenesin 100 MG/5ML syrup  Commonly known as:  ROBITUSSIN  Take 200 mg by mouth 3  (three) times daily. scheduled     ipratropium-albuterol 0.5-2.5 (3) MG/3ML Soln  Commonly known as:  DUONEB  Take 3 mLs by nebulization 4 (four) times daily as needed (wheezing, shortness of breath).     lidocaine 2 % solution  Commonly known as:  XYLOCAINE  Apply 15 mLs topically every 2 (two) hours as needed (pain from cath). Apply to penis for discomfort     lisinopril 5 MG tablet  Commonly known as:  PRINIVIL,ZESTRIL  Take 5 mg by mouth every morning.     magic mouthwash Soln  Swish and spit 15 mLs daily as needed for mouth pain.     magnesium hydroxide 400 MG/5ML suspension  Commonly known as:  MILK OF MAGNESIA  Take 30 mLs by mouth daily as needed for mild constipation.     Melatonin 1 MG Tabs  Take 1 mg by mouth at bedtime.     omeprazole 40 MG capsule  Commonly known as:  PRILOSEC  Take 40 mg by mouth daily before breakfast.     polyethylene glycol packet  Commonly known as:  MIRALAX / GLYCOLAX  Take 17 g by mouth every morning.     traZODone 50 MG tablet  Commonly known as:  DESYREL  Take 50 mg by mouth at bedtime as  needed for sleep.     URIBEL 118 MG Caps  Take 1 capsule by mouth 3 (three) times daily as needed (pain).        Please HPI for pertinent positives, otherwise complete 10 system ROS negative.  Physical Exam: BP 120/81  Pulse 111  Temp(Src) 98.2 F (36.8 C) (Axillary)  Resp 20  Wt 85 lb (38.556 kg)  SpO2 88% There is no height on file to calculate BMI. O2 sats 88%, started on [place] O2, improved to 90%  General Appearance:  Awake and responsive. Nods yes or no.  ENT: Poor dentition, no lesions  Neck: Supple, symmetrical, trachea midline  Lungs:   Coarse BS on left, clear on right.  Chest Wall:  Scoliotic and contracted  Heart:  Regular rate and rhythm, S1, S2 normal, no murmur, rub or gallop.  Abdomen:   Soft, non-tender, non distended.   Reviewed attached labs from facility WBC 12K, PLTs ok, INR-nl   Assessment/Plan Dysphagia,  PCM Discussed IR placement of g-tube with pt and brother. This will be a difficult case dur to scoliosis and contractures. Explained procedure, risks, complications, use of sedation Labs reviewed. Consent signed by brother He understands possibility that procedure may not be able to be done safely.  Brayton ElBRUNING, Deyanna Mctier PA-C 04/29/2013, 8:49 AM

## 2013-04-29 NOTE — Progress Notes (Signed)
[facility] and gave her report.

## 2013-04-29 NOTE — Procedures (Signed)
Placement of gastrostomy tube with CT and fluoroscopic guidance.  14 French tube in stomach and placement confirmed with imaging.  No immediate complication but evidence for aspiration of barium prior to the procedure.

## 2013-05-23 DEATH — deceased
